# Patient Record
Sex: Female | Born: 2000 | Race: White | Hispanic: No | Marital: Single | Smoking: Current some day smoker
Health system: Southern US, Community
[De-identification: ages and names within clinical notes are randomized; demographics above are authoritative.]

## PROBLEM LIST (undated history)

## (undated) DIAGNOSIS — F32A Depression, unspecified: Secondary | ICD-10-CM

## (undated) DIAGNOSIS — R569 Unspecified convulsions: Secondary | ICD-10-CM

## (undated) DIAGNOSIS — R011 Cardiac murmur, unspecified: Secondary | ICD-10-CM

## (undated) DIAGNOSIS — G40909 Epilepsy, unspecified, not intractable, without status epilepticus: Secondary | ICD-10-CM

## (undated) DIAGNOSIS — F10129 Alcohol abuse with intoxication, unspecified: Secondary | ICD-10-CM

## (undated) DIAGNOSIS — F3481 Disruptive mood dysregulation disorder: Secondary | ICD-10-CM

## (undated) DIAGNOSIS — Z720 Tobacco use: Secondary | ICD-10-CM

## (undated) DIAGNOSIS — Z7289 Other problems related to lifestyle: Secondary | ICD-10-CM

## (undated) DIAGNOSIS — D649 Anemia, unspecified: Secondary | ICD-10-CM

## (undated) DIAGNOSIS — F419 Anxiety disorder, unspecified: Secondary | ICD-10-CM

## (undated) DIAGNOSIS — F1210 Cannabis abuse, uncomplicated: Secondary | ICD-10-CM

## (undated) DIAGNOSIS — D496 Neoplasm of unspecified behavior of brain: Secondary | ICD-10-CM

## (undated) HISTORY — DX: Tobacco use: Z72.0

## (undated) HISTORY — DX: Epilepsy, unspecified, not intractable, without status epilepticus: G40.909

## (undated) HISTORY — DX: Alcohol abuse with intoxication, unspecified: F10.129

## (undated) HISTORY — DX: Disruptive mood dysregulation disorder: F34.81

## (undated) HISTORY — DX: Anemia, unspecified: D64.9

## (undated) HISTORY — DX: Anxiety disorder, unspecified: F41.9

## (undated) HISTORY — PX: OTHER SURGICAL HISTORY: SHX169

## (undated) HISTORY — PX: NO PAST SURGERIES: SHX2092

## (undated) HISTORY — DX: Other problems related to lifestyle: Z72.89

## (undated) HISTORY — DX: Depression, unspecified: F32.A

---
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
## 2021-04-08 ENCOUNTER — Ambulatory Visit (INDEPENDENT_AMBULATORY_CARE_PROVIDER_SITE_OTHER): Payer: Medicaid Other | Admitting: Certified Nurse Midwife

## 2021-04-08 ENCOUNTER — Encounter: Payer: Self-pay | Admitting: Certified Nurse Midwife

## 2021-04-08 ENCOUNTER — Other Ambulatory Visit: Payer: Self-pay

## 2021-04-08 VITALS — BP 132/85 | HR 98 | Ht 62.0 in | Wt 126.2 lb

## 2021-04-08 DIAGNOSIS — R569 Unspecified convulsions: Secondary | ICD-10-CM | POA: Diagnosis not present

## 2021-04-08 DIAGNOSIS — N911 Secondary amenorrhea: Secondary | ICD-10-CM | POA: Diagnosis not present

## 2021-04-08 NOTE — Patient Instructions (Signed)
Secondary Amenorrhea  Secondary amenorrhea occurs when a female who was previously having menstrual periods has not had them for 3-6 months. A menstrual period is the monthly shedding of the lining of the uterus. The lining of the uterus is made up of blood, tissue, fluid, and mucus. The flow of blood usually occurs during 3-7consecutive days each month. This condition has many causes. In many cases, treating the underlying causewill return menstrual periods back to a normal cycle. What are the causes? The most common cause of this condition is pregnancy. Other medical conditions that can cause secondary amenorrhea include: Cirrhosis of the liver. Conditions of the blood. Diabetes. Epilepsy. Chronic kidney disease. Polycystic ovary disease. A hormonal imbalance. Ovarian failure. Cystic fibrosis. Early menopause. Cushing syndrome. Thyroid problems. Other causes may include: Malnutrition. Stress or anxiety. Medicines. Extreme obesity. Low body weight or drastic weight loss. Removal of the ovaries or uterus. Contraceptive pills, patches, or vaginal rings. What increases the risk? You are more likely to develop this condition if: You have a family history of this condition. You have an eating disorder. You do extreme athletic training. You have a chronic disease. You abuse substances such as alcohol or cigarettes. What are the signs or symptoms? The main symptom of this condition is a lack of menstrual periods for 3-6months in a female who previously had menstrual periods. How is this diagnosed? This condition may be diagnosed based on: Your medical history. A physical exam. A pelvic exam to check for problems with your reproductive organs. A procedure to examine the uterus. A measurement of your body mass index (BMI). You may also have other tests, including: Blood tests that measure certain hormones in your body and rule out pregnancy. Urine tests. Imaging tests, such as an  ultrasound, CT scan, or MRI. How is this treated? Treatment for this condition depends on the cause of the amenorrhea. It may involve: Correcting diet-related problems. Treating underlying conditions. Medicines. Lifestyle changes. Surgery. If the condition cannot be corrected, it is sometimes possible to startmenstrual periods with medicines. Follow these instructions at home: Lifestyle     Maintain a healthy diet. In general, a healthy diet includes lots of fruits and vegetables, low-fat dairy products, lean meats, and foods that contain fiber. Ask to meet with a registered dietitian for nutrition counseling and meal planning. Maintain a healthy weight. Talk to your health care provider before trying any new diet or exercise plan. Exercise at least 30 minutes 5 or more days each week. Exercising includes brisk walking, yard work, biking, running, swimming, and team sports like basketball and soccer. Ask your health care provider which exercises are safe for you. Get enough sleep. Plan your sleep time to allow for 7-9 hours of sleep each night. Learn to manage stress. Explore relaxation techniques such as meditation, journaling, yoga, or tai chi. General instructions Be aware of changes in your menstrual cycle. Keep a record of when you have your menstrual period. Note the date your period starts, how long it lasts, and any problems you experience. Take over-the-counter and prescription medicines only as told by your health care provider. Keep all follow-up visits. This is important. Contact a health care provider if: Your periods do not return to normal after treatment. Summary Secondary amenorrhea is when a female who was previously having menstrual periods has not gotten her period for 3-6 months. This condition has many causes. In many cases, treating the underlying cause will return menstrual periods back to a normal cycle. Talk to   to your health care provider if your periods do not  return to normal after treatment. This information is not intended to replace advice given to you by your health care provider. Make sure you discuss any questions you have with your health care provider. Document Revised: 09/27/2019 Document Reviewed: 09/27/2019 Elsevier Patient Education  2022 [employer].

## 2021-04-08 NOTE — Progress Notes (Addendum)
GYN ENCOUNTER NOTE  Subjective:       Diane Kelly is a 21 y.o. G0P0000 female is here for gynecologic evaluation of the following issues:  1. No period x 1 year, pt state she was on birth control (depo injections) approximately 1 yr ago. She started her period at 21 yrs old states they were regular until starting birth control . She has used nexplanon a while ago and stopped depo injections x 1 yr. She denies any significant weight changes. She does have a history of epilepsy and is on Depakote and trileptal.     Gynecologic History No LMP recorded (lmp unknown). Contraception: condoms Last Pap: n/a .   Obstetric History OB History  Gravida Para Term Preterm AB Living  0 0 0 0 0 0  SAB IAB Ectopic Multiple Live Births  0 0 0 0 0    Past Medical History:  Diagnosis Date   Epilepsy ([place])    Seizures ([place])     Past Surgical History:  Procedure Laterality Date   denies      Current Outpatient Medications on File Prior to Visit  Medication Sig Dispense Refill   desmopressin (DDAVP) 0.1 MG tablet Take 0.5 tablets (0.05 mg total) by mouth at bedtime. (Patient not taking: Reported on 09/05/2019) 30 tablet 0   diazepam (DIASTAT ACUDIAL) 10 MG GEL Place 10 mg rectally once as needed for seizure. (Patient not taking: Reported on 09/05/2019)     divalproex (DEPAKOTE) 250 MG DR tablet Take 1 tablet (250 mg total) by mouth 3 (three) times daily. (Patient not taking: Reported on 09/05/2019) 90 tablet 0   escitalopram (LEXAPRO) 10 MG tablet Take 1 tablet (10 mg total) by mouth daily. (Patient not taking: Reported on 09/05/2019) 30 tablet 0   Oxcarbazepine (TRILEPTAL) 300 MG tablet Take 1 tablet (300 mg total) by mouth 2 (two) times daily. 60 tablet 0   No current facility-administered medications on file prior to visit.    No Known Allergies  Social History   Socioeconomic History   Marital status: Single    Spouse name: Not on file   Number of children: Not on file   Years of  education: Not on file   Highest education level: Not on file  Occupational History   Not on file  Tobacco Use   Smoking status: Every Day    Packs/day: 0.50    Types: Cigarettes   Smokeless tobacco: Current  Vaping Use   Vaping Use: Never used  Substance and Sexual Activity   Alcohol use: Yes   Drug use: Yes    Types: Marijuana   Sexual activity: Yes    Birth control/protection: Implant  Other Topics Concern   Not on file  Social History Narrative   Not on file   Social Determinants of Health   Financial Resource Strain: Not on file  Food Insecurity: Not on file  Transportation Needs: Not on file  Physical Activity: Not on file  Stress: Not on file  Social Connections: Not on file  Intimate Partner Violence: Not on file    Family History  Problem Relation Age of Onset   Hypertension Mother    COPD Mother    COPD Maternal Grandmother    Anxiety disorder Maternal Grandmother    Sleep apnea Maternal Grandmother    COPD Maternal Grandfather    Anxiety disorder Paternal Grandmother    Asthma Paternal Grandmother    Depression Paternal Grandmother     The following portions  of the patient's history were reviewed and updated as appropriate: allergies, current medications, past family history, past medical history, past social history, past surgical history and problem list.  Review of Systems Review of Systems - Negative except as mentioned in hpi Review of Systems - General ROS: negative for - chills, fatigue, fever, hot flashes, malaise or night sweats Hematological and Lymphatic ROS: negative for - bleeding problems or swollen lymph nodes Gastrointestinal ROS: negative for - abdominal pain, blood in stools, change in bowel habits and nausea/vomiting Musculoskeletal ROS: negative for - joint pain, muscle pain or muscular weakness Genito-Urinary ROS: negative for -  dysmenorrhea, dyspareunia, dysuria, genital discharge, genital ulcers, hematuria, incontinence,  irregular/heavy menses, nocturia or pelvic pain. Positive for change in menstrual cycle,  Objective:   BP 132/85    Pulse 98    Ht 5\' 2"  (1.575 m)    Wt 126 lb 3.2 oz (57.2 kg)    LMP  (LMP Unknown)    BMI 23.08 kg/m  CONSTITUTIONAL: Well-developed, well-nourished female in no acute distress.  HENT:  Normocephalic, atraumatic.  NECK: Normal range of motion, supple, no masses.  Normal thyroid.  SKIN: Skin is warm and dry. No rash noted. Not diaphoretic. No erythema. No pallor. [place]: Alert and oriented to person, place, and time. PSYCHIATRIC: Normal mood and affect. Normal behavior. Normal judgment and thought content. CARDIOVASCULAR:Not Examined RESPIRATORY: Not Examined BREASTS: Not Examined ABDOMEN: Soft, non distended; Non tender.  No Organomegaly. PELVIC: not indicated u/s ordered  MUSCULOSKELETAL: Normal range of motion. No tenderness.  No cyanosis, clubbing, or edema.   Assessment:  Secondary Amenorrhea    Plan:   Discussed possible causes including current medications she is on for epilepsy. Also reviewed PCOS. Orders placed for labs and u/s. Discussed use of birth control . Will follow up with results. Pt to return for u/s as soon as able and annual exam with pap in August.   Philip Aspen, CNM

## 2021-04-09 ENCOUNTER — Other Ambulatory Visit: Payer: Medicaid Other

## 2021-04-09 LAB — FSH/LH
FSH: 1.7 m[IU]/mL
LH: 13.7 m[IU]/mL

## 2021-04-09 LAB — TSH: TSH: 1.33 u[IU]/mL (ref 0.450–4.500)

## 2021-04-09 LAB — TESTOSTERONE: Testosterone: 24 ng/dL (ref 13–71)

## 2021-04-09 LAB — PROLACTIN: Prolactin: 10.8 ng/mL (ref 4.8–23.3)

## 2021-04-09 NOTE — Progress Notes (Signed)
LM with pt relaying normal lab results.

## 2021-04-10 LAB — BETA HCG QUANT (REF LAB): hCG Quant: 6 m[IU]/mL

## 2021-04-10 LAB — SPECIMEN STATUS REPORT

## 2021-04-12 ENCOUNTER — Other Ambulatory Visit: Payer: Self-pay | Admitting: Certified Nurse Midwife

## 2021-04-12 DIAGNOSIS — N912 Amenorrhea, unspecified: Secondary | ICD-10-CM

## 2021-04-15 NOTE — Progress Notes (Signed)
LM for patient to call back in regards to lab results.

## 2021-04-15 NOTE — Progress Notes (Signed)
Relayed result to pt., patient verbalized understanding and voiced no questions or concerns, agrees to come in for suggested lab work.

## 2021-04-16 ENCOUNTER — Other Ambulatory Visit: Payer: Medicaid Other

## 2021-04-21 ENCOUNTER — Other Ambulatory Visit: Payer: Self-pay

## 2021-04-21 ENCOUNTER — Other Ambulatory Visit: Payer: Medicaid Other

## 2021-04-22 LAB — FSH/LH
FSH: 6.9 m[IU]/mL
LH: 10.9 m[IU]/mL

## 2021-04-22 LAB — BETA HCG QUANT (REF LAB): hCG Quant: <1 m[IU]/mL

## 2021-04-22 LAB — DHEA-SULFATE: DHEA-SO4: 62.7 ug/dL — ABNORMAL LOW (ref 110.0–431.7)

## 2021-04-22 LAB — INSULIN, RANDOM: INSULIN: 10.3 u[IU]/mL (ref 2.6–24.9)

## 2021-04-22 LAB — GLUCOSE, RANDOM: Glucose: 94 mg/dL (ref 70–99)

## 2021-05-05 NOTE — Progress Notes (Signed)
LM for patient to return call.

## 2021-10-03 ENCOUNTER — Encounter: Payer: Self-pay | Admitting: Certified Nurse Midwife

## 2021-10-21 ENCOUNTER — Encounter: Payer: Medicaid Other | Admitting: Certified Nurse Midwife

## 2021-11-17 ENCOUNTER — Other Ambulatory Visit: Payer: Self-pay

## 2021-11-17 DIAGNOSIS — Z20822 Contact with and (suspected) exposure to covid-19: Secondary | ICD-10-CM | POA: Insufficient documentation

## 2021-11-17 DIAGNOSIS — J029 Acute pharyngitis, unspecified: Secondary | ICD-10-CM | POA: Diagnosis not present

## 2021-11-17 DIAGNOSIS — J069 Acute upper respiratory infection, unspecified: Secondary | ICD-10-CM | POA: Diagnosis not present

## 2021-11-17 DIAGNOSIS — Z5321 Procedure and treatment not carried out due to patient leaving prior to being seen by health care provider: Secondary | ICD-10-CM | POA: Insufficient documentation

## 2021-11-17 LAB — GROUP A STREP BY PCR: Group A Strep by PCR: NOT DETECTED

## 2021-11-17 NOTE — ED Provider Triage Note (Signed)
Emergency Medicine Provider Triage Evaluation Note  Diane Kelly , a 21 y.o. female  was evaluated in triage.  Pt complains of sore throat and uri symptoms. No SOB, difficulty swallowing.  Review of Systems  Positive: Uri symptoms Negative: CP, SOB, difficulty swallowing  Physical Exam  BP 128/83 (BP Location: Left Arm)   Pulse 91   Temp 98.4 F (36.9 C) (Oral)   Resp 18   Ht 5\' 2"  (1.575 m)   Wt 54 kg   LMP  (LMP Unknown)   SpO2 100%   BMI 21.77 kg/m  Gen:   Awake, no distress   Resp:  Normal effort  MSK:   Moves extremities without difficulty  Other:    Medical Decision Making  Medically screening exam initiated at 11:06 PM.  Appropriate orders placed.  Diane Kelly was informed that the remainder of the evaluation will be completed by another provider, this initial triage assessment does not replace that evaluation, and the importance of remaining in the ED until their evaluation is complete.  URI symptoms. Strep and covid ordered   Darletta Moll, PA-C 11/17/21 2306

## 2021-11-17 NOTE — ED Triage Notes (Signed)
Pt arrives from home POV. C/O increasing soreness in throat since this morning, "foggy head", H/A, "hot and cold flashes," runny nose,strong cough, sore upper back.Denies N/V. States that she took "zinc" tablet  without relief and another medication which she can not name.    Pt ambulatory and NAD to triage.

## 2021-11-18 ENCOUNTER — Emergency Department
Admission: EM | Admit: 2021-11-18 | Discharge: 2021-11-18 | Discharge status: Against Medical Advice | Payer: Medicaid Other | Attending: Emergency Medicine | Admitting: Emergency Medicine

## 2021-11-18 LAB — RESP PANEL BY RT-PCR (FLU A&B, COVID) ARPGX2
Influenza A by PCR: NEGATIVE
Influenza B by PCR: NEGATIVE
SARS Coronavirus 2 by RT PCR: NEGATIVE

## 2021-12-24 ENCOUNTER — Other Ambulatory Visit: Payer: Self-pay | Admitting: Physician Assistant

## 2021-12-24 DIAGNOSIS — R569 Unspecified convulsions: Secondary | ICD-10-CM

## 2022-01-14 ENCOUNTER — Ambulatory Visit
Admission: RE | Admit: 2022-01-14 | Discharge: 2022-01-14 | Disposition: A | Discharge status: Home / Self Care | Payer: Medicaid Other | Source: Ambulatory Visit | Attending: Physician Assistant | Admitting: Physician Assistant

## 2022-01-14 DIAGNOSIS — R569 Unspecified convulsions: Secondary | ICD-10-CM | POA: Diagnosis present

## 2022-01-14 DIAGNOSIS — G9389 Other specified disorders of brain: Secondary | ICD-10-CM

## 2022-01-14 HISTORY — DX: Other specified disorders of brain: G93.89

## 2022-01-14 MED ORDER — GADOBUTROL 1 MMOL/ML IV SOLN
5.0000 mL | Freq: Once | INTRAVENOUS | Status: AC | PRN
  Administered 2022-01-14: 5 mL via INTRAVENOUS

## 2022-08-24 ENCOUNTER — Telehealth: Payer: Self-pay

## 2022-08-24 NOTE — Telephone Encounter (Signed)
Notified Diane Kelly that Dr Myer Haff said he is happy to see her.

## 2022-08-24 NOTE — Telephone Encounter (Addendum)
-----   Message from Venetia Night, MD sent at 08/24/2022  2:32 PM EDT ----- Regarding: RE: MRI brain Happy to see her   ----- Message ----- From: Sharlot Gowda, RN Sent: 08/24/2022   8:23 AM EDT To: Venetia Night, MD Subject: MRI brain                                      Hey,  Janice Coffin, PA from [facility] asked if you would review this brain lesion. She would like to know if they should refer her to you or if this needs to go to [facility]?

## 2022-09-10 NOTE — Progress Notes (Deleted)
Referring Physician:  Bridgette Habermann, PA-C [address] [place],  [place] [address]  Primary Physician:  Alm Bustard, NP  History of Present Illness: 09/10/2022 [name] is here today with a chief complaint of ***  Patient is here to be evaluated for ongoing seizure-like activity. Notes that most recent seizure was around June 2024.   Past Surgery: ***? Denies   Diane Kelly has ***no symptoms of cervical myelopathy.  The symptoms are causing a significant impact on the patient's life.   I have utilized the care everywhere function in epic to review the outside records available from external health systems.  Review of Systems:  A 10 point review of systems is negative, except for the pertinent positives and negatives detailed in the HPI.  Past Medical History: Past Medical History:  Diagnosis Date   Epilepsy ([facility])    Seizures ([facility])     Past Surgical History: Past Surgical History:  Procedure Laterality Date   denies      Allergies: Allergies as of 09/15/2022   (No Known Allergies)    Medications:  Current Outpatient Medications:    desmopressin (DDAVP) 0.1 MG tablet, Take 0.5 tablets (0.05 mg total) by mouth at bedtime. (Patient not taking: Reported on 09/05/2019), Disp: 30 tablet, Rfl: 0   diazepam (DIASTAT ACUDIAL) 10 MG GEL, Place 10 mg rectally once as needed for seizure. (Patient not taking: Reported on 09/05/2019), Disp: , Rfl:    divalproex (DEPAKOTE) 250 MG DR tablet, Take 1 tablet (250 mg total) by mouth 3 (three) times daily. (Patient not taking: Reported on 09/05/2019), Disp: 90 tablet, Rfl: 0   escitalopram (LEXAPRO) 10 MG tablet, Take 1 tablet (10 mg total) by mouth daily. (Patient not taking: Reported on 09/05/2019), Disp: 30 tablet, Rfl: 0   Oxcarbazepine (TRILEPTAL) 300 MG tablet, Take 1 tablet (300 mg total) by mouth 2 (two) times daily., Disp: 60 tablet, Rfl: 0  Social History: Social History   Tobacco Use   Smoking  status: Every Day    Current packs/day: 0.50    Types: Cigarettes   Smokeless tobacco: Current  Vaping Use   Vaping status: Never Used  Substance Use Topics   Alcohol use: Yes    Comment: socially   Drug use: Not Currently    Family Medical History: Family History  Problem Relation Age of Onset   Hypertension Mother    COPD Mother    COPD Maternal Grandmother    Anxiety disorder Maternal Grandmother    Sleep apnea Maternal Grandmother    COPD Maternal Grandfather    Anxiety disorder Paternal Grandmother    Asthma Paternal Grandmother    Depression Paternal Grandmother     Physical Examination: There were no vitals filed for this visit.  General: Patient is in no apparent distress. Attention to examination is appropriate.  Neck:   Supple.  Full range of motion.  Respiratory: Patient is breathing without any difficulty.   NEUROLOGICAL:     Awake, alert, oriented to person, place, and time.  Speech is clear and fluent.   Cranial Nerves: Pupils equal round and reactive to light.  Facial tone is symmetric.  Facial sensation is symmetric. Shoulder shrug is symmetric. Tongue protrusion is midline.  There is no pronator drift.  Strength: Side Biceps Triceps Deltoid Interossei Grip Wrist Ext. Wrist Flex.  R 5 5 5 5 5 5 5   L 5 5 5 5 5 5 5    Side Iliopsoas Quads Hamstring  PF DF EHL  R 5 5 5 5 5 5   L 5 5 5 5 5 5    Reflexes are ***2+ and symmetric at the biceps, triceps, brachioradialis, patella and achilles.   Hoffman's is absent.   Bilateral upper and lower extremity sensation is intact to light touch.    No evidence of dysmetria noted.  Gait is normal.     Medical Decision Making  Imaging: ***  I have personally reviewed the images and agree with the above interpretation.  Assessment and Plan: [name] is a pleasant 22 y.o. female with ***    Thank you for involving me in the care of this patient.      Chester K. Myer Haff MD, [facility]

## 2022-09-15 ENCOUNTER — Ambulatory Visit: Payer: Medicaid Other | Admitting: Neurosurgery

## 2022-09-25 ENCOUNTER — Other Ambulatory Visit: Payer: Self-pay

## 2022-09-25 ENCOUNTER — Emergency Department
Admission: EM | Admit: 2022-09-25 | Discharge: 2022-09-25 | Disposition: A | Discharge status: Home / Self Care | Payer: Medicaid Other | Attending: Emergency Medicine | Admitting: Emergency Medicine

## 2022-09-25 ENCOUNTER — Emergency Department: Payer: Medicaid Other

## 2022-09-25 DIAGNOSIS — R000 Tachycardia, unspecified: Secondary | ICD-10-CM | POA: Diagnosis not present

## 2022-09-25 DIAGNOSIS — R569 Unspecified convulsions: Secondary | ICD-10-CM | POA: Insufficient documentation

## 2022-09-25 DIAGNOSIS — R519 Headache, unspecified: Secondary | ICD-10-CM | POA: Insufficient documentation

## 2022-09-25 DIAGNOSIS — N3090 Cystitis, unspecified without hematuria: Secondary | ICD-10-CM | POA: Insufficient documentation

## 2022-09-25 LAB — URINE DRUG SCREEN, QUALITATIVE (ARMC ONLY)
Amphetamines, Ur Screen: NOT DETECTED
Barbiturates, Ur Screen: NOT DETECTED
Benzodiazepine, Ur Scrn: NOT DETECTED
Cannabinoid 50 Ng, Ur ~~LOC~~: POSITIVE — AB
Cocaine Metabolite,Ur ~~LOC~~: NOT DETECTED
MDMA (Ecstasy)Ur Screen: NOT DETECTED
Methadone Scn, Ur: NOT DETECTED
Opiate, Ur Screen: NOT DETECTED
Phencyclidine (PCP) Ur S: NOT DETECTED
Tricyclic, Ur Screen: NOT DETECTED

## 2022-09-25 LAB — URINALYSIS, ROUTINE W REFLEX MICROSCOPIC
Bilirubin Urine: NEGATIVE
Glucose, UA: NEGATIVE mg/dL
Ketones, ur: 5 mg/dL — AB
Nitrite: POSITIVE — AB
Protein, ur: 30 mg/dL — AB
Specific Gravity, Urine: 1.02 (ref 1.005–1.030)
pH: 5 (ref 5.0–8.0)

## 2022-09-25 LAB — CBC
HCT: 38.1 % (ref 36.0–46.0)
Hemoglobin: 12.8 g/dL (ref 12.0–15.0)
MCH: 29.8 pg (ref 26.0–34.0)
MCHC: 33.6 g/dL (ref 30.0–36.0)
MCV: 88.6 fL (ref 80.0–100.0)
Platelets: 265 10*3/uL (ref 150–400)
RBC: 4.3 MIL/uL (ref 3.87–5.11)
RDW: 12.6 % (ref 11.5–15.5)
WBC: 14.2 10*3/uL — ABNORMAL HIGH (ref 4.0–10.5)
nRBC: 0 % (ref 0.0–0.2)

## 2022-09-25 LAB — COMPREHENSIVE METABOLIC PANEL
ALT: 14 U/L (ref 0–44)
AST: 15 U/L (ref 15–41)
Albumin: 4.3 g/dL (ref 3.5–5.0)
Alkaline Phosphatase: 77 U/L (ref 38–126)
Anion gap: 11 (ref 5–15)
BUN: 13 mg/dL (ref 6–20)
CO2: 23 mmol/L (ref 22–32)
Calcium: 8.9 mg/dL (ref 8.9–10.3)
Chloride: 104 mmol/L (ref 98–111)
Creatinine, Ser: 0.68 mg/dL (ref 0.44–1.00)
GFR, Estimated: >60 mL/min (ref >60)
Glucose, Bld: 127 mg/dL — ABNORMAL HIGH (ref 70–99)
Potassium: 3.4 mmol/L — ABNORMAL LOW (ref 3.5–5.1)
Sodium: 138 mmol/L (ref 135–145)
Total Bilirubin: 0.4 mg/dL (ref 0.3–1.2)
Total Protein: 7.3 g/dL (ref 6.5–8.1)

## 2022-09-25 LAB — TROPONIN I (HIGH SENSITIVITY)
Troponin I (High Sensitivity): 40 ng/L — ABNORMAL HIGH (ref <18)
Troponin I (High Sensitivity): 76 ng/L — ABNORMAL HIGH (ref <18)

## 2022-09-25 LAB — POC URINE PREG, ED: Preg Test, Ur: NEGATIVE

## 2022-09-25 MED ORDER — ONDANSETRON HCL 4 MG/2ML IJ SOLN
4.0000 mg | Freq: Once | INTRAMUSCULAR | Status: AC
  Administered 2022-09-25: 4 mg via INTRAVENOUS
  Filled 2022-09-25: qty 2

## 2022-09-25 MED ORDER — SODIUM CHLORIDE 0.9 % IV BOLUS
1000.0000 mL | Freq: Once | INTRAVENOUS | Status: AC
  Administered 2022-09-25: 1000 mL via INTRAVENOUS

## 2022-09-25 MED ORDER — CEPHALEXIN 500 MG PO CAPS
500.0000 mg | ORAL_CAPSULE | Freq: Once | ORAL | Status: AC
  Administered 2022-09-25: 500 mg via ORAL
  Filled 2022-09-25: qty 1

## 2022-09-25 MED ORDER — KETOROLAC TROMETHAMINE 15 MG/ML IJ SOLN
15.0000 mg | Freq: Once | INTRAMUSCULAR | Status: AC
  Administered 2022-09-25: 15 mg via INTRAVENOUS
  Filled 2022-09-25: qty 1

## 2022-09-25 MED ORDER — CEPHALEXIN 500 MG PO CAPS: 500.0000 mg | ORAL_CAPSULE | Freq: Two times a day (BID) | ORAL | 0 refills | Status: AC

## 2022-09-25 NOTE — ED Provider Notes (Signed)
[facility] Provider Note    Event Date/Time   First MD Initiated Contact with Patient 09/25/22 0229     (approximate)   History   Chief Complaint: Seizures   HPI  Diane Kelly is a 22 y.o. female with a history of epilepsy, mood disorder who comes the ED complaining of headache.  She reports having a seizure tonight around midnight which followed a typical pattern for her including a prodrome that is familiar, loss of consciousness, and then having headache afterward.  Denies any trauma or fever or other complaints, no recent illness.  She reports that her seizures seem to be worse when she is stressed and she is feeling stressed because she lost her job.     Physical Exam   Triage Vital Signs: ED Triage Vitals  Encounter Vitals Group     BP 09/25/22 0212 120/82     Systolic BP Percentile --      Diastolic BP Percentile --      Pulse Rate 09/25/22 0212 89     Resp 09/25/22 0212 18     Temp 09/25/22 0212 97.8 F (36.6 C)     Temp Source 09/25/22 0212 Oral     SpO2 09/25/22 0212 100 %     Weight 09/25/22 0217 115 lb (52.2 kg)     Height 09/25/22 0217 5\' 2"  (1.575 m)     Head Circumference --      Peak Flow --      Pain Score 09/25/22 0215 10     Pain Loc --      Pain Education --      Exclude from Growth Chart --     Most recent vital signs: Vitals:   09/25/22 0212 09/25/22 0430  BP: 120/82 (!) 102/55  Pulse: 89 73  Resp: 18   Temp: 97.8 F (36.6 C)   SpO2: 100% 98%    General: Awake, no distress.  CV:  Good peripheral perfusion.  Resp:  Normal effort.  Abd:  No distention.  Other:  Awake alert oriented.  GCS 15, moving all extremities   ED Results / Procedures / Treatments   Labs (all labs ordered are listed, but only abnormal results are displayed) Labs Reviewed  CBC - Abnormal; Notable for the following components:      Result Value   WBC 14.2 (*)    All other components within normal limits  COMPREHENSIVE  METABOLIC PANEL - Abnormal; Notable for the following components:   Potassium 3.4 (*)    Glucose, Bld 127 (*)    All other components within normal limits  URINALYSIS, ROUTINE W REFLEX MICROSCOPIC - Abnormal; Notable for the following components:   Color, Urine YELLOW (*)    APPearance CLOUDY (*)    Hgb urine dipstick SMALL (*)    Ketones, ur 5 (*)    Protein, ur 30 (*)    Nitrite POSITIVE (*)    Leukocytes,Ua SMALL (*)    Bacteria, UA RARE (*)    All other components within normal limits  URINE DRUG SCREEN, QUALITATIVE ([facility] ONLY) - Abnormal; Notable for the following components:   Cannabinoid 50 Ng, Ur [place] POSITIVE (*)    All other components within normal limits  TROPONIN I (HIGH SENSITIVITY) - Abnormal; Notable for the following components:   Troponin I (High Sensitivity) 40 (*)    All other components within normal limits  TROPONIN I (HIGH SENSITIVITY) - Abnormal; Notable for the following components:  Troponin I (High Sensitivity) 76 (*)    All other components within normal limits  POC URINE PREG, ED     EKG Interpreted by me Sinus tachycardia rate 101.  Normal axis, normal intervals.  Normal QRS ST segments and T waves   RADIOLOGY CT head interpreted by me, appears normal.  Radiology report reviewed   PROCEDURES:  Procedures   MEDICATIONS ORDERED IN ED: Medications  ketorolac (TORADOL) 15 MG/ML injection 15 mg (15 mg Intravenous Given 09/25/22 0316)  ondansetron (ZOFRAN) injection 4 mg (4 mg Intravenous Given 09/25/22 0315)  sodium chloride 0.9 % bolus 1,000 mL (0 mLs Intravenous Stopped 09/25/22 0419)  cephALEXin (KEFLEX) capsule 500 mg (500 mg Oral Given 09/25/22 0441)     IMPRESSION / MDM / ASSESSMENT AND PLAN / ED COURSE  I reviewed the triage vital signs and the nursing notes.  DDx: Seizure, dehydration, electrolyte abnormality, UTI, pregnancy, complicated migraine  Patient's presentation is most consistent with acute presentation with potential threat to  life or bodily function.  Patient presents with suspected seizure.  She has been compliant with her medications.  Exam and vital signs are unremarkable.  She is back to baseline mental status.  Serum labs unremarkable, pregnancy negative.  CT head negative.  Will give Toradol Zofran and fluids for symptom relief.   ----------------------------------------- 5:30 AM on 09/25/2022 ----------------------------------------- Labs reveal UTI as well as cannabinoid use, both of which may increase propensity for seizure.  Will start on Keflex.  No further seizure activity in the ED, stable for discharge.      FINAL CLINICAL IMPRESSION(S) / ED DIAGNOSES   Final diagnoses:  Seizure ([facility])  Cystitis     Rx / DC Orders   ED Discharge Orders          Ordered    cephALEXin (KEFLEX) 500 MG capsule  2 times daily        09/25/22 0529             Note:  This document was prepared using Dragon voice recognition software and may include unintentional dictation errors.   Sharman Cheek, MD 09/25/22 0530

## 2022-09-25 NOTE — ED Triage Notes (Signed)
Pt presents to ER with c/o possible seizure that happened between 2330-0000 yesterday.  Pt reports she was feeling weird, tried to call her s/o, and then went out.  Pt states she has hx of epilepsy and takes oxcarbazepine.  States she has taken meds as prescribed.  Pt endorses severe HA and pain all over at this time.  Pt is otherwise A&O x4 and in NAD at this time.

## 2022-11-18 ENCOUNTER — Other Ambulatory Visit: Payer: Self-pay | Admitting: Neurology

## 2022-11-18 DIAGNOSIS — R569 Unspecified convulsions: Secondary | ICD-10-CM

## 2022-12-01 ENCOUNTER — Encounter: Payer: Self-pay | Admitting: Neurology

## 2022-12-02 NOTE — Progress Notes (Deleted)
Referring Physician:  Bridgette Habermann, PA-C [address] [place],  [place] [address]  Primary Physician:  Alm Bustard, NP  History of Present Illness: 12/02/2022 Ms. Ninamarie Keel is here today with a chief complaint of ***  Brain Lesion that was found on a Head CT.  She has some issues with her vision? Denies any headaches or dizziness?  Past Surgery: ***no previous spinal surgeries  Balinda Quails Kelleher has ***no symptoms of cervical myelopathy.  The symptoms are causing a significant impact on the patient's life.   I have utilized the care everywhere function in epic to review the outside records available from external health systems.  Review of Systems:  A 10 point review of systems is negative, except for the pertinent positives and negatives detailed in the HPI.  Past Medical History: Past Medical History:  Diagnosis Date   Epilepsy (HCC)    Seizures (HCC)     Past Surgical History: Past Surgical History:  Procedure Laterality Date   denies      Allergies: Allergies as of 12/03/2022   (No Known Allergies)    Medications:  Current Outpatient Medications:    desmopressin (DDAVP) 0.1 MG tablet, Take 0.5 tablets (0.05 mg total) by mouth at bedtime. (Patient not taking: Reported on 09/05/2019), Disp: 30 tablet, Rfl: 0   diazepam (DIASTAT ACUDIAL) 10 MG GEL, Place 10 mg rectally once as needed for seizure. (Patient not taking: Reported on 09/05/2019), Disp: , Rfl:    divalproex (DEPAKOTE) 250 MG DR tablet, Take 1 tablet (250 mg total) by mouth 3 (three) times daily. (Patient not taking: Reported on 09/05/2019), Disp: 90 tablet, Rfl: 0   escitalopram (LEXAPRO) 10 MG tablet, Take 1 tablet (10 mg total) by mouth daily., Disp: 30 tablet, Rfl: 0   Oxcarbazepine (TRILEPTAL) 300 MG tablet, Take 1 tablet (300 mg total) by mouth 2 (two) times daily. (Patient not taking: Reported on 09/25/2022), Disp: 60 tablet, Rfl: 0   oxcarbazepine (TRILEPTAL) 600 MG tablet,  TAKE 1 TABLET BY MOUTH EVERY MORNING AND 1 & 1/2 TABLET AT NIGHT, Disp: , Rfl:   Social History: Social History   Tobacco Use   Smoking status: Every Day    Current packs/day: 0.50    Types: Cigarettes   Smokeless tobacco: Current  Vaping Use   Vaping status: Never Used  Substance Use Topics   Alcohol use: Yes    Comment: socially   Drug use: Not Currently    Family Medical History: Family History  Problem Relation Age of Onset   Hypertension Mother    COPD Mother    COPD Maternal Grandmother    Anxiety disorder Maternal Grandmother    Sleep apnea Maternal Grandmother    COPD Maternal Grandfather    Anxiety disorder Paternal Grandmother    Asthma Paternal Grandmother    Depression Paternal Grandmother     Physical Examination: There were no vitals filed for this visit.  General: Patient is in no apparent distress. Attention to examination is appropriate.  Neck:   Supple.  Full range of motion.  Respiratory: Patient is breathing without any difficulty.   NEUROLOGICAL:     Awake, alert, oriented to person, place, and time.  Speech is clear and fluent.   Cranial Nerves: Pupils equal round and reactive to light.  Facial tone is symmetric.  Facial sensation is symmetric. Shoulder shrug is symmetric. Tongue protrusion is midline.  There is no pronator drift.  Strength: Side Biceps Triceps Deltoid Interossei Grip  Wrist Ext. Wrist Flex.  R 5 5 5 5 5 5 5   L 5 5 5 5 5 5 5    Side Iliopsoas Quads Hamstring PF DF EHL  R 5 5 5 5 5 5   L 5 5 5 5 5 5    Reflexes are ***2+ and symmetric at the biceps, triceps, brachioradialis, patella and achilles.   Hoffman's is absent.   Bilateral upper and lower extremity sensation is intact to light touch.    No evidence of dysmetria noted.  Gait is normal.     Medical Decision Making  Imaging: ***  I have personally reviewed the images and agree with the above interpretation.  Assessment and Plan: Ms. Verhoeven is a pleasant  22 y.o. female with ***    Thank you for involving me in the care of this patient.      Chester K. Myer Haff MD, [facility] Neurosurgery

## 2022-12-03 ENCOUNTER — Ambulatory Visit: Payer: Medicaid Other | Admitting: Neurosurgery

## 2022-12-03 ENCOUNTER — Encounter: Payer: Self-pay | Admitting: Neurosurgery

## 2022-12-07 ENCOUNTER — Ambulatory Visit (INDEPENDENT_AMBULATORY_CARE_PROVIDER_SITE_OTHER): Payer: Medicaid Other | Admitting: Neurosurgery

## 2022-12-07 ENCOUNTER — Encounter: Payer: Self-pay | Admitting: Neurosurgery

## 2022-12-07 ENCOUNTER — Ambulatory Visit
Admission: RE | Admit: 2022-12-07 | Discharge: 2022-12-07 | Disposition: A | Discharge status: Home / Self Care | Payer: Medicaid Other | Source: Ambulatory Visit | Attending: Neurology | Admitting: Neurology

## 2022-12-07 VITALS — BP 108/62 | Ht 62.0 in | Wt 108.0 lb

## 2022-12-07 DIAGNOSIS — R569 Unspecified convulsions: Secondary | ICD-10-CM | POA: Diagnosis not present

## 2022-12-07 DIAGNOSIS — D496 Neoplasm of unspecified behavior of brain: Secondary | ICD-10-CM | POA: Diagnosis not present

## 2022-12-07 MED ORDER — GADOPICLENOL 0.5 MMOL/ML IV SOLN
7.5000 mL | Freq: Once | INTRAVENOUS | Status: AC | PRN
  Administered 2022-12-07: 5 mL via INTRAVENOUS

## 2022-12-07 NOTE — Patient Instructions (Signed)
Please see below for information in regards to your upcoming surgery:   Planned surgery: right craniotomy for tumor resection   Surgery date: 01/25/23 at [facility] (Medical Mall: [address] [place], [place] [address]) - you will find out your arrival time the business day before your surgery.   Pre-op appointment at [facility] Pre-admit Testing: we will call you with a date/time for this. If you are scheduled for an in person appointment, Pre-admit Testing is located on the first floor of the [facility] building, [address], [address]. Please bring all prescriptions in the original prescription bottles to your appointment. During this appointment, they will advise you which medications you can take the morning of surgery, and which medications you will need to hold for surgery. Labs (such as blood work, EKG) may be done at your pre-op appointment. You are not required to fast for these labs. Should you need to change your pre-op appointment, please call Pre-admit testing at [phone].       Surgical clearance: we will send a clearance form to Manson Allan, NP. They may wish to see you in their office prior to signing the clearance form. If so, they may call you to schedule an appointment.    Common restrictions after surgery: No bending, lifting, or twisting ("BLT"). Avoid lifting objects heavier than 10 pounds for the first 6 weeks after surgery. Where possible, avoid household activities that involve lifting, bending, reaching, pushing, or pulling such as laundry, vacuuming, grocery shopping, and childcare. Try to arrange for help from friends and family for these activities while you heal. Do not drive while taking prescription pain medication. Weeks 6 through 12 after surgery: avoid lifting more than 25 pounds.      How to contact us:  If you have any questions/concerns before or after surgery, you can reach [place] at [phone], or  you can send a mychart message. We can be reached by phone or mychart 8am-4pm, Monday-Friday.  *Please note: Calls after 4pm are forwarded to a third party answering service. Mychart messages are not routinely monitored during evenings, weekends, and holidays. Please call our office to contact the answering service for urgent concerns during non-business hours.    If you have FMLA/disability paperwork, please drop it off or fax it to [phone], attention Patty.   Appointments/FMLA & disability paperwork: Joycelyn Rua, & Flonnie Hailstone Registered Nurse/Surgery scheduler: Royston Cowper Medical Assistants: Nash Mantis Physician Assistants: Manning Charity, PA-C & Drake Leach, PA-C Surgeons: Venetia Night, MD & Ernestine Mcmurray, MD

## 2022-12-07 NOTE — Progress Notes (Unsigned)
Referring Physician:  Alm Bustard, NP [address] [place],  [place] [address]  Primary Physician:  Alm Bustard, NP  History of Present Illness: 12/07/2022 [name] is here today with a chief complaint of seizure disorder which is going on for many years.  She had a CT scan performed several years ago which showed evidence of a right temporal lesion.  She has been managed for her seizures for several years with different medications.  She is currently switching to lamotrigine.  She has seizures monthly, some of which generalized.  After a generalized seizure, she has a period of time where she is postictal.  She suffers from headaches during this time, as well as issues with moderation of her behavior.  She takes 1 to 2 days to return to normal.  She had a lesion found on her head CT, and ultimately was worked up with MRI scan.  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  The symptoms are causing a significant impact on the patient's life.   I have utilized the care everywhere function in epic to review the outside records available from external health systems.  Review of Systems:  A 10 point review of systems is negative, except for the pertinent positives and negatives detailed in the HPI.  Past Medical History: Past Medical History:  Diagnosis Date   Epilepsy (HCC)    Seizures (HCC)     Past Surgical History: Past Surgical History:  Procedure Laterality Date   denies      Allergies: Allergies as of 12/07/2022   (No Known Allergies)    Medications:  Current Outpatient Medications:    escitalopram (LEXAPRO) 20 MG tablet, Take 20 mg by mouth daily., Disp: , Rfl:    lamoTRIgine (LAMICTAL) 25 MG tablet, Wk 1:  1 tab PM, Wk 2:  1 tab AM and 1 tab PM, Wk 3:  1 tab AM and 2 tabs Pm, Wk 4:  2 tabs AM and 2 tabs PM Wk 5:  2 tabs AM and 3 tabs PM, Wk 6:  3 tabs AM and 3 tabs PM, Wk 7:  3 tabs AM and 4 tabs PM Week 8:  4 tabs AM and 4 tabs  PM, Wk 9:  4 tabs AM, and 5 tabs PM, Wk 10: 5 tabs AM, and 5 tabs PM, Disp: , Rfl:    meloxicam (MOBIC) 15 MG tablet, Take 15 mg by mouth daily., Disp: , Rfl:   Social History: Social History   Tobacco Use   Smoking status: Every Day    Current packs/day: 0.50    Types: Cigarettes   Smokeless tobacco: Current  Vaping Use   Vaping status: Never Used  Substance Use Topics   Alcohol use: Yes    Comment: socially   Drug use: Not Currently    Family Medical History: Family History  Problem Relation Age of Onset   Hypertension Mother    COPD Mother    COPD Maternal Grandmother    Anxiety disorder Maternal Grandmother    Sleep apnea Maternal Grandmother    COPD Maternal Grandfather    Anxiety disorder Paternal Grandmother    Asthma Paternal Grandmother    Depression Paternal Grandmother     Physical Examination: Vitals:   12/07/22 1541  BP: 108/62    General: Patient is in no apparent distress. Attention to examination is appropriate.  Neck:   Supple.  Full range of motion.  Respiratory: Patient is  breathing without any difficulty.   NEUROLOGICAL:     Awake, alert, oriented to person, place, and time.  Speech is clear and fluent.   Cranial Nerves: Pupils equal round and reactive to light.  Facial tone is symmetric.  Facial sensation is symmetric. Shoulder shrug is symmetric. Tongue protrusion is midline.  There is no pronator drift.  VFF to confrontation  Strength: Side Biceps Triceps Deltoid Interossei Grip Wrist Ext. Wrist Flex.  R 5 5 5 5 5 5 5   L 5 5 5 5 5 5 5    Side Iliopsoas Quads Hamstring PF DF EHL  R 5 5 5 5 5 5   L 5 5 5 5 5 5    Reflexes are 1+ and symmetric at the biceps, triceps, brachioradialis, patella and achilles.   Hoffman's is absent.   Bilateral upper and lower extremity sensation is intact to light touch.    No evidence of dysmetria noted.  Gait is normal.     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 Differential includes low-grade neoplasm (favored) or possibly an anterior temporal lobe perivascular space.     Electronically Signed   By: Lesia Hausen M.D.   On: 01/16/2022 14:26    MR Brain 12/07/2022 Similar appearance to above with slight increase on my measurement.  I have personally reviewed the images and agree with the above interpretation.  Assessment and Plan: Diane Kelly is a pleasant 22 y.o. 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  There is a small cystic component as well as a FLAIR abnormality.  Given the questionable change between the last year scan at this point, I have recommended surgical intervention with resection of this lesion.  There is some chance that this is the proximate cause of her seizure disorder.  There is an additional reason to consider lesionectomy.  Because of these 2 issues, I have recommended right-sided craniotomy for resection of tumor.  We will utilize [employer] for image guidance.  I discussed the planned procedure at length with the patient, including the risks, benefits, alternatives, and indications. The risks discussed include but are not limited to bleeding, infection, need for reoperation, spinal fluid leak, stroke, vision loss, anesthetic complication, coma, paralysis, and even death. I also described in detail that improvement was not guaranteed.  The patient expressed understanding of these risks, and asked that we proceed with surgery. I described the surgery in layman's terms, and gave ample opportunity for questions, which were answered to the best of my ability.  I spent a total of 30 minutes in this patient's care today. This time was spent reviewing pertinent records including imaging studies, obtaining and confirming history, performing a  directed evaluation, formulating and discussing my recommendations, and documenting the visit within the medical record.     Thank you for involving me in the care of this patient.      Samaia Iwata K. Myer Haff MD, [facility] Neurosurgery

## 2022-12-16 ENCOUNTER — Other Ambulatory Visit: Payer: Self-pay

## 2022-12-16 DIAGNOSIS — Z01818 Encounter for other preprocedural examination: Secondary | ICD-10-CM

## 2023-01-13 ENCOUNTER — Encounter
Admission: RE | Admit: 2023-01-13 | Discharge: 2023-01-13 | Disposition: A | Discharge status: Home / Self Care | Payer: Medicaid Other | Source: Ambulatory Visit | Attending: Neurosurgery | Admitting: Neurosurgery

## 2023-01-13 VITALS — BP 102/67 | HR 79 | Resp 14 | Ht 62.0 in | Wt 105.8 lb

## 2023-01-13 DIAGNOSIS — R569 Unspecified convulsions: Secondary | ICD-10-CM

## 2023-01-13 DIAGNOSIS — Z01812 Encounter for preprocedural laboratory examination: Secondary | ICD-10-CM | POA: Diagnosis present

## 2023-01-13 DIAGNOSIS — G9389 Other specified disorders of brain: Secondary | ICD-10-CM | POA: Insufficient documentation

## 2023-01-13 DIAGNOSIS — Z01818 Encounter for other preprocedural examination: Secondary | ICD-10-CM

## 2023-01-13 DIAGNOSIS — Z79899 Other long term (current) drug therapy: Secondary | ICD-10-CM | POA: Insufficient documentation

## 2023-01-13 HISTORY — DX: Cannabis abuse, uncomplicated: F12.10

## 2023-01-13 HISTORY — DX: Tobacco use: Z72.0

## 2023-01-13 HISTORY — DX: Neoplasm of unspecified behavior of brain: D49.6

## 2023-01-13 HISTORY — DX: Cardiac murmur, unspecified: R01.1

## 2023-01-13 LAB — CBC
HCT: 38.2 % (ref 36.0–46.0)
Hemoglobin: 13 g/dL (ref 12.0–15.0)
MCH: 29.6 pg (ref 26.0–34.0)
MCHC: 34 g/dL (ref 30.0–36.0)
MCV: 87 fL (ref 80.0–100.0)
Platelets: 253 10*3/uL (ref 150–400)
RBC: 4.39 MIL/uL (ref 3.87–5.11)
RDW: 12.5 % (ref 11.5–15.5)
WBC: 6.8 10*3/uL (ref 4.0–10.5)
nRBC: 0 % (ref 0.0–0.2)

## 2023-01-13 LAB — SURGICAL PCR SCREEN
MRSA, PCR: NEGATIVE
Staphylococcus aureus: NEGATIVE

## 2023-01-13 LAB — BASIC METABOLIC PANEL
Anion gap: 5 (ref 5–15)
BUN: 16 mg/dL (ref 6–20)
CO2: 25 mmol/L (ref 22–32)
Calcium: 9.2 mg/dL (ref 8.9–10.3)
Chloride: 106 mmol/L (ref 98–111)
Creatinine, Ser: 0.83 mg/dL (ref 0.44–1.00)
GFR, Estimated: >60 mL/min (ref >60)
Glucose, Bld: 78 mg/dL (ref 70–99)
Potassium: 3.9 mmol/L (ref 3.5–5.1)
Sodium: 136 mmol/L (ref 135–145)

## 2023-01-13 LAB — TYPE AND SCREEN
ABO/RH(D): O POS
Antibody Screen: NEGATIVE

## 2023-01-13 NOTE — Patient Instructions (Addendum)
Your procedure is scheduled on:01-25-23 Monday Report to the Registration Desk on the 1st floor of the Medical Mall.Then proceed to the 2nd floor Surgery Desk To find out your arrival time, please call [phone] between 1PM - 3PM on:01-22-23 Friday If your arrival time is 6:00 am, do not arrive before that time as the Medical Mall entrance doors do not open until 6:00 am.  REMEMBER: Instructions that are not followed completely may result in serious medical risk, up to and including death; or upon the discretion of your surgeon and anesthesiologist your surgery may need to be rescheduled.  Do not eat food after midnight the night before surgery.  No gum chewing or hard candies.  You may however, drink CLEAR liquids up to 2 hours before you are scheduled to arrive for your surgery. Do not drink anything within 2 hours of your scheduled arrival time.  Clear liquids include: - water  - apple juice without pulp - gatorade (not RED colors) - black coffee or tea (Do NOT add milk or creamers to the coffee or tea) Do NOT drink anything that is not on this list.  One week prior to surgery:Stop NOW (01-17-23) Stop ANY OVER THE COUNTER supplements until after surgery.  You may however, continue to take Tylenol if needed for pain up until the day of surgery.  Continue taking all of your other prescription medications up until the day of surgery.  ON THE DAY OF SURGERY ONLY TAKE THESE MEDICATIONS WITH SIPS OF WATER: -lamoTRIgine (LAMICTAL)   No Alcohol for 24 hours before or after surgery.  No Smoking including e-cigarettes for 24 hours before surgery.  No chewable tobacco products for at least 6 hours before surgery.  No nicotine patches on the day of surgery.  Do not use any "recreational" drugs for at least a week (preferably 2 weeks) before your surgery.  Please be advised that the combination of cocaine and anesthesia may have negative outcomes, up to and including death. If you  test positive for cocaine, your surgery will be cancelled.  On the morning of surgery brush your teeth with toothpaste and water, you may rinse your mouth with mouthwash if you wish. Do not swallow any toothpaste or mouthwash  Do not wear jewelry, make-up, hairpins, clips or nail polish.  For welded (permanent) jewelry: bracelets, anklets, waist bands, etc.  Please have this removed prior to surgery.  If it is not removed, there is a chance that hospital personnel will need to cut it off on the day of surgery.  Do not wear lotions, powders, or perfumes.   Do not shave body hair from the neck down 48 hours before surgery.  Contact lenses, hearing aids and dentures may not be worn into surgery.  Do not bring valuables to the hospital. [facility] is not responsible for any missing/lost belongings or valuables.   Notify your doctor if there is any change in your medical condition (cold, fever, infection).  Wear comfortable clothing (specific to your surgery type) to the hospital.  After surgery, you can help prevent lung complications by doing breathing exercises.  Take deep breaths and cough every 1-2 hours. Your doctor may order a device called an Incentive Spirometer to help you take deep breaths. When coughing or sneezing, hold a pillow firmly against your incision with both hands. This is called "splinting." Doing this helps protect your incision. It also decreases belly discomfort.  If you are being admitted to the hospital overnight, leave your suitcase  in the car. After surgery it may be brought to your room.  In case of increased patient census, it may be necessary for you, the patient, to continue your postoperative care in the Same Day Surgery department.  If you are being discharged the day of surgery, you will not be allowed to drive home. You will need a responsible individual to drive you home and stay with you for 24 hours after surgery.   If you are taking public  transportation, you will need to have a responsible individual with you.  Please call the Pre-admissions Testing Dept. at [phone] if you have any questions about these instructions.  Surgery Visitation Policy:  Patients having surgery or a procedure may have two visitors.  Children under the age of 76 must have an adult with them who is not the patient.  Inpatient Visitation:    Visiting hours are 7 a.m. to 8 p.m. Up to four visitors are allowed at one time in a patient room. The visitors may rotate out with other people during the day.  One visitor age 48 or older may stay with the patient overnight and must be in the room by 8 p.m.

## 2023-01-22 ENCOUNTER — Telehealth: Payer: Self-pay | Admitting: Emergency Medicine

## 2023-01-22 NOTE — Telephone Encounter (Signed)
Called and spoke with patient to inform her to arrive at 830 AM Monday morning for surgery.  Pt verbalized understanding.  Asked about her number and significant others not working and she reports her number is correct and phone was dead and her significant others number has changed to the number dialed.  Updated in chart.

## 2023-01-25 ENCOUNTER — Other Ambulatory Visit: Payer: Self-pay

## 2023-01-25 ENCOUNTER — Inpatient Hospital Stay: Payer: Medicaid Other

## 2023-01-25 ENCOUNTER — Inpatient Hospital Stay
Admission: RE | Admit: 2023-01-25 | Discharge: 2023-01-27 | DRG: 026 | Disposition: A | Discharge status: Home / Self Care | Payer: Medicaid Other | Attending: Neurosurgery | Admitting: Neurosurgery

## 2023-01-25 ENCOUNTER — Inpatient Hospital Stay: Payer: Medicaid Other | Admitting: Urgent Care

## 2023-01-25 ENCOUNTER — Encounter
Admission: RE | Disposition: A | Discharge status: Home / Self Care | Payer: Self-pay | Source: Home / Self Care | Attending: Neurosurgery

## 2023-01-25 ENCOUNTER — Encounter: Payer: Self-pay | Admitting: Neurosurgery

## 2023-01-25 DIAGNOSIS — I471 Supraventricular tachycardia, unspecified: Secondary | ICD-10-CM | POA: Diagnosis not present

## 2023-01-25 DIAGNOSIS — E878 Other disorders of electrolyte and fluid balance, not elsewhere classified: Secondary | ICD-10-CM | POA: Diagnosis present

## 2023-01-25 DIAGNOSIS — R569 Unspecified convulsions: Secondary | ICD-10-CM | POA: Diagnosis present

## 2023-01-25 DIAGNOSIS — G40909 Epilepsy, unspecified, not intractable, without status epilepticus: Secondary | ICD-10-CM | POA: Diagnosis present

## 2023-01-25 DIAGNOSIS — R112 Nausea with vomiting, unspecified: Secondary | ICD-10-CM | POA: Diagnosis not present

## 2023-01-25 DIAGNOSIS — F17210 Nicotine dependence, cigarettes, uncomplicated: Secondary | ICD-10-CM | POA: Diagnosis present

## 2023-01-25 DIAGNOSIS — Z825 Family history of asthma and other chronic lower respiratory diseases: Secondary | ICD-10-CM | POA: Diagnosis not present

## 2023-01-25 DIAGNOSIS — Z01818 Encounter for other preprocedural examination: Secondary | ICD-10-CM

## 2023-01-25 DIAGNOSIS — R748 Abnormal levels of other serum enzymes: Secondary | ICD-10-CM | POA: Diagnosis present

## 2023-01-25 DIAGNOSIS — Z8249 Family history of ischemic heart disease and other diseases of the circulatory system: Secondary | ICD-10-CM | POA: Diagnosis not present

## 2023-01-25 DIAGNOSIS — Z9152 Personal history of nonsuicidal self-harm: Secondary | ICD-10-CM

## 2023-01-25 DIAGNOSIS — R519 Headache, unspecified: Secondary | ICD-10-CM | POA: Diagnosis not present

## 2023-01-25 DIAGNOSIS — R4182 Altered mental status, unspecified: Secondary | ICD-10-CM | POA: Diagnosis not present

## 2023-01-25 DIAGNOSIS — D496 Neoplasm of unspecified behavior of brain: Principal | ICD-10-CM | POA: Diagnosis present

## 2023-01-25 DIAGNOSIS — G40901 Epilepsy, unspecified, not intractable, with status epilepticus: Secondary | ICD-10-CM | POA: Diagnosis not present

## 2023-01-25 DIAGNOSIS — F17290 Nicotine dependence, other tobacco product, uncomplicated: Secondary | ICD-10-CM | POA: Diagnosis present

## 2023-01-25 DIAGNOSIS — Z818 Family history of other mental and behavioral disorders: Secondary | ICD-10-CM | POA: Diagnosis not present

## 2023-01-25 HISTORY — PX: APPLICATION OF CRANIAL NAVIGATION: SHX6578

## 2023-01-25 HISTORY — PX: CRANIOTOMY: SHX93

## 2023-01-25 LAB — POCT PREGNANCY, URINE: Preg Test, Ur: NEGATIVE

## 2023-01-25 LAB — GLUCOSE, CAPILLARY: Glucose-Capillary: 123 mg/dL — ABNORMAL HIGH (ref 70–99)

## 2023-01-25 LAB — MRSA NEXT GEN BY PCR, NASAL: MRSA by PCR Next Gen: NOT DETECTED

## 2023-01-25 LAB — ABO/RH: ABO/RH(D): O POS

## 2023-01-25 SURGERY — CRANIOTOMY TUMOR EXCISION
Anesthesia: General | Laterality: Right

## 2023-01-25 MED ORDER — ORAL CARE MOUTH RINSE: 15.0000 mL | OROMUCOSAL | Status: DC | PRN

## 2023-01-25 MED ORDER — GELATIN ABSORBABLE 12-7 MM EX MISC
CUTANEOUS | Status: AC
  Filled 2023-01-25: qty 1

## 2023-01-25 MED ORDER — DOCUSATE SODIUM 100 MG PO CAPS
100.0000 mg | ORAL_CAPSULE | Freq: Two times a day (BID) | ORAL | Status: DC
  Administered 2023-01-25 – 2023-01-27 (×4): 100 mg via ORAL
  Filled 2023-01-25 (×4): qty 1

## 2023-01-25 MED ORDER — LACTATED RINGERS IV SOLN: INTRAVENOUS | Status: DC

## 2023-01-25 MED ORDER — ACETAMINOPHEN 10 MG/ML IV SOLN: 15.0000 mg/kg | Freq: Once | INTRAVENOUS | Status: DC | PRN

## 2023-01-25 MED ORDER — ACETAMINOPHEN 10 MG/ML IV SOLN
INTRAVENOUS | Status: AC
  Filled 2023-01-25: qty 100

## 2023-01-25 MED ORDER — DROPERIDOL 2.5 MG/ML IJ SOLN
INTRAMUSCULAR | Status: AC
  Filled 2023-01-25: qty 2

## 2023-01-25 MED ORDER — THROMBIN 5000 UNITS EX SOLR
CUTANEOUS | Status: AC
  Filled 2023-01-25: qty 5000

## 2023-01-25 MED ORDER — OXYCODONE HCL 5 MG PO TABS: 5.0000 mg | ORAL_TABLET | Freq: Once | ORAL | Status: DC | PRN

## 2023-01-25 MED ORDER — SUCCINYLCHOLINE CHLORIDE 200 MG/10ML IV SOSY
PREFILLED_SYRINGE | INTRAVENOUS | Status: DC | PRN
  Administered 2023-01-25: 60 mg via INTRAVENOUS

## 2023-01-25 MED ORDER — DEXAMETHASONE SODIUM PHOSPHATE 4 MG/ML IJ SOLN
4.0000 mg | Freq: Three times a day (TID) | INTRAMUSCULAR | Status: DC
  Administered 2023-01-26 – 2023-01-27 (×2): 4 mg via INTRAVENOUS
  Filled 2023-01-25 (×2): qty 1

## 2023-01-25 MED ORDER — REMIFENTANIL HCL 1 MG IV SOLR
INTRAVENOUS | Status: DC | PRN
  Administered 2023-01-25: .15 ug/kg/min via INTRAVENOUS

## 2023-01-25 MED ORDER — DEXAMETHASONE SODIUM PHOSPHATE 10 MG/ML IJ SOLN
INTRAMUSCULAR | Status: DC | PRN
  Administered 2023-01-25: 10 mg via INTRAVENOUS

## 2023-01-25 MED ORDER — DEXAMETHASONE SODIUM PHOSPHATE 10 MG/ML IJ SOLN
6.0000 mg | Freq: Four times a day (QID) | INTRAMUSCULAR | Status: AC
  Administered 2023-01-25 – 2023-01-26 (×2): 6 mg via INTRAVENOUS
  Filled 2023-01-25 (×2): qty 0.6

## 2023-01-25 MED ORDER — HYDROMORPHONE HCL 1 MG/ML IJ SOLN
INTRAMUSCULAR | Status: AC
  Filled 2023-01-25: qty 1

## 2023-01-25 MED ORDER — SODIUM CHLORIDE 0.9 % IV SOLN: INTRAVENOUS | Status: DC | PRN

## 2023-01-25 MED ORDER — NALOXONE HCL 0.4 MG/ML IJ SOLN: 0.0800 mg | INTRAMUSCULAR | Status: DC | PRN

## 2023-01-25 MED ORDER — ORAL CARE MOUTH RINSE: 15.0000 mL | Freq: Once | OROMUCOSAL | Status: AC

## 2023-01-25 MED ORDER — DROPERIDOL 2.5 MG/ML IJ SOLN
0.6250 mg | Freq: Once | INTRAMUSCULAR | Status: AC
  Administered 2023-01-25: 0.625 mg via INTRAVENOUS

## 2023-01-25 MED ORDER — FENTANYL CITRATE (PF) 100 MCG/2ML IJ SOLN
INTRAMUSCULAR | Status: AC
  Filled 2023-01-25: qty 2

## 2023-01-25 MED ORDER — LIDOCAINE-EPINEPHRINE 1 %-1:100000 IJ SOLN
INTRAMUSCULAR | Status: DC | PRN
  Administered 2023-01-25: 10 mL

## 2023-01-25 MED ORDER — MORPHINE SULFATE (PF) 2 MG/ML IV SOLN
1.0000 mg | INTRAVENOUS | Status: DC | PRN
  Administered 2023-01-25 – 2023-01-26 (×2): 1 mg via INTRAVENOUS
  Filled 2023-01-25 (×2): qty 1

## 2023-01-25 MED ORDER — ACETAMINOPHEN 650 MG RE SUPP: 650.0000 mg | RECTAL | Status: DC | PRN

## 2023-01-25 MED ORDER — CEFAZOLIN SODIUM-DEXTROSE 2-4 GM/100ML-% IV SOLN
INTRAVENOUS | Status: AC
  Filled 2023-01-25: qty 100

## 2023-01-25 MED ORDER — CHLORHEXIDINE GLUCONATE 0.12 % MT SOLN
OROMUCOSAL | Status: AC
  Filled 2023-01-25: qty 15

## 2023-01-25 MED ORDER — LAMOTRIGINE 25 MG PO TABS
125.0000 mg | ORAL_TABLET | Freq: Two times a day (BID) | ORAL | Status: DC
  Administered 2023-01-25 – 2023-01-27 (×4): 125 mg via ORAL
  Filled 2023-01-25 (×4): qty 5

## 2023-01-25 MED ORDER — 0.9 % SODIUM CHLORIDE (POUR BTL) OPTIME
TOPICAL | Status: DC | PRN
  Administered 2023-01-25: 500 mL

## 2023-01-25 MED ORDER — ENOXAPARIN SODIUM 40 MG/0.4ML IJ SOSY
40.0000 mg | PREFILLED_SYRINGE | INTRAMUSCULAR | Status: DC
  Administered 2023-01-26 – 2023-01-27 (×2): 40 mg via SUBCUTANEOUS
  Filled 2023-01-25 (×2): qty 0.4

## 2023-01-25 MED ORDER — HYDROMORPHONE HCL 1 MG/ML IJ SOLN
INTRAMUSCULAR | Status: DC | PRN
  Administered 2023-01-25 (×2): .5 mg via INTRAVENOUS

## 2023-01-25 MED ORDER — MANNITOL 20 % IV SOLN: INTRAVENOUS | Status: DC | PRN

## 2023-01-25 MED ORDER — LIDOCAINE-EPINEPHRINE 1 %-1:100000 IJ SOLN
INTRAMUSCULAR | Status: AC
Start: 2023-01-25 — End: ?
  Filled 2023-01-25: qty 1

## 2023-01-25 MED ORDER — PHENYLEPHRINE HCL-NACL 20-0.9 MG/250ML-% IV SOLN
INTRAVENOUS | Status: DC | PRN
  Administered 2023-01-25: 25 ug/min via INTRAVENOUS

## 2023-01-25 MED ORDER — GLYCOPYRROLATE 0.2 MG/ML IJ SOLN
INTRAMUSCULAR | Status: DC | PRN
  Administered 2023-01-25: .2 mg via INTRAVENOUS

## 2023-01-25 MED ORDER — ACETAMINOPHEN 10 MG/ML IV SOLN
INTRAVENOUS | Status: DC | PRN
  Administered 2023-01-25: 1000 mg via INTRAVENOUS

## 2023-01-25 MED ORDER — OXYCODONE HCL 5 MG/5ML PO SOLN: 5.0000 mg | Freq: Once | ORAL | Status: DC | PRN

## 2023-01-25 MED ORDER — DEXAMETHASONE SODIUM PHOSPHATE 4 MG/ML IJ SOLN
4.0000 mg | Freq: Four times a day (QID) | INTRAMUSCULAR | Status: AC
Start: 2023-01-26 — End: 2023-01-26
  Administered 2023-01-26 (×2): 4 mg via INTRAVENOUS
  Filled 2023-01-25 (×2): qty 1

## 2023-01-25 MED ORDER — PROMETHAZINE HCL 25 MG PO TABS: 12.5000 mg | ORAL_TABLET | ORAL | Status: DC | PRN

## 2023-01-25 MED ORDER — MAGNESIUM CITRATE PO SOLN: 1.0000 | Freq: Once | ORAL | Status: DC | PRN

## 2023-01-25 MED ORDER — MIDAZOLAM HCL 2 MG/2ML IJ SOLN
INTRAMUSCULAR | Status: AC
  Filled 2023-01-25: qty 2

## 2023-01-25 MED ORDER — BACITRACIN ZINC 500 UNIT/GM EX OINT
TOPICAL_OINTMENT | CUTANEOUS | Status: AC
  Filled 2023-01-25: qty 28.35

## 2023-01-25 MED ORDER — SODIUM CHLORIDE (PF) 0.9 % IJ SOLN
INTRAMUSCULAR | Status: AC
Start: 2023-01-25 — End: ?
  Filled 2023-01-25: qty 50

## 2023-01-25 MED ORDER — PHENYLEPHRINE HCL-NACL 20-0.9 MG/250ML-% IV SOLN
INTRAVENOUS | Status: AC
  Filled 2023-01-25: qty 250

## 2023-01-25 MED ORDER — SODIUM CHLORIDE 0.9 % IV SOLN
INTRAVENOUS | Status: DC | PRN
  Administered 2023-01-25: 1000 mg via INTRAVENOUS

## 2023-01-25 MED ORDER — FENTANYL CITRATE (PF) 100 MCG/2ML IJ SOLN: 25.0000 ug | INTRAMUSCULAR | Status: DC | PRN

## 2023-01-25 MED ORDER — PROPOFOL 10 MG/ML IV BOLUS
INTRAVENOUS | Status: DC | PRN
  Administered 2023-01-25: 10 mg via INTRAVENOUS
  Administered 2023-01-25: 20 mg via INTRAVENOUS
  Administered 2023-01-25: 200 mg via INTRAVENOUS

## 2023-01-25 MED ORDER — ONDANSETRON HCL 4 MG PO TABS
4.0000 mg | ORAL_TABLET | ORAL | Status: DC | PRN
  Administered 2023-01-27: 4 mg via ORAL
  Filled 2023-01-25: qty 1

## 2023-01-25 MED ORDER — ACETAMINOPHEN 325 MG PO TABS
650.0000 mg | ORAL_TABLET | ORAL | Status: DC | PRN
  Filled 2023-01-25 (×2): qty 2

## 2023-01-25 MED ORDER — REMIFENTANIL HCL 1 MG IV SOLR
INTRAVENOUS | Status: AC
  Filled 2023-01-25: qty 1000

## 2023-01-25 MED ORDER — LIDOCAINE HCL (CARDIAC) PF 100 MG/5ML IV SOSY
PREFILLED_SYRINGE | INTRAVENOUS | Status: DC | PRN
  Administered 2023-01-25: 60 mg via INTRAVENOUS

## 2023-01-25 MED ORDER — SURGIFLO WITH THROMBIN (HEMOSTATIC MATRIX KIT) OPTIME
TOPICAL | Status: DC | PRN
  Administered 2023-01-25: 1 via TOPICAL

## 2023-01-25 MED ORDER — POLYETHYLENE GLYCOL 3350 17 G PO PACK: 17.0000 g | PACK | Freq: Every day | ORAL | Status: DC | PRN

## 2023-01-25 MED ORDER — FENTANYL CITRATE (PF) 100 MCG/2ML IJ SOLN
INTRAMUSCULAR | Status: DC | PRN
  Administered 2023-01-25 (×3): 50 ug via INTRAVENOUS

## 2023-01-25 MED ORDER — ONDANSETRON HCL 4 MG/2ML IJ SOLN
INTRAMUSCULAR | Status: DC | PRN
  Administered 2023-01-25 (×2): 4 mg via INTRAVENOUS

## 2023-01-25 MED ORDER — CHLORHEXIDINE GLUCONATE CLOTH 2 % EX PADS
6.0000 | MEDICATED_PAD | Freq: Every day | CUTANEOUS | Status: DC
  Administered 2023-01-26: 6 via TOPICAL

## 2023-01-25 MED ORDER — CEFAZOLIN IN SODIUM CHLORIDE 2-0.9 GM/100ML-% IV SOLN
2.0000 g | Freq: Once | INTRAVENOUS | Status: AC
Start: 2023-01-25 — End: 2023-01-25
  Administered 2023-01-25: 2 g via INTRAVENOUS
  Administered 2023-01-25: 1 g via INTRAVENOUS
  Filled 2023-01-25: qty 100

## 2023-01-25 MED ORDER — LABETALOL HCL 5 MG/ML IV SOLN: 10.0000 mg | INTRAVENOUS | Status: DC | PRN

## 2023-01-25 MED ORDER — GELATIN ABSORBABLE 12-7 MM EX MISC
CUTANEOUS | Status: AC
  Filled 2023-01-25: qty 2

## 2023-01-25 MED ORDER — CHLORHEXIDINE GLUCONATE 0.12 % MT SOLN
15.0000 mL | Freq: Once | OROMUCOSAL | Status: AC
  Administered 2023-01-25: 15 mL via OROMUCOSAL

## 2023-01-25 MED ORDER — SORBITOL 70 % SOLN: 30.0000 mL | Freq: Every day | Status: DC | PRN

## 2023-01-25 MED ORDER — HYDROCODONE-ACETAMINOPHEN 5-325 MG PO TABS
1.0000 | ORAL_TABLET | ORAL | Status: DC | PRN
  Administered 2023-01-25: 1 via ORAL
  Filled 2023-01-25: qty 1

## 2023-01-25 MED ORDER — ONDANSETRON HCL 4 MG/2ML IJ SOLN: 4.0000 mg | Freq: Once | INTRAMUSCULAR | Status: DC | PRN

## 2023-01-25 MED ORDER — ONDANSETRON HCL 4 MG/2ML IJ SOLN
4.0000 mg | INTRAMUSCULAR | Status: DC | PRN
  Administered 2023-01-25 – 2023-01-26 (×2): 4 mg via INTRAVENOUS
  Filled 2023-01-25 (×3): qty 2

## 2023-01-25 MED ORDER — BACITRACIN 500 UNIT/GM EX OINT
TOPICAL_OINTMENT | CUTANEOUS | Status: DC | PRN
  Administered 2023-01-25 (×2): 1 via TOPICAL

## 2023-01-25 MED ORDER — GELATIN ABSORBABLE 12-7 MM EX MISC
CUTANEOUS | Status: DC | PRN
  Administered 2023-01-25: 1 via TOPICAL

## 2023-01-25 MED ORDER — MIDAZOLAM HCL 2 MG/2ML IJ SOLN
INTRAMUSCULAR | Status: DC | PRN
  Administered 2023-01-25: 2 mg via INTRAVENOUS

## 2023-01-25 MED ORDER — SENNA 8.6 MG PO TABS
1.0000 | ORAL_TABLET | Freq: Two times a day (BID) | ORAL | Status: DC
  Administered 2023-01-25 – 2023-01-26 (×3): 8.6 mg via ORAL
  Filled 2023-01-25 (×3): qty 1

## 2023-01-25 MED ORDER — BUSPIRONE HCL 10 MG PO TABS
15.0000 mg | ORAL_TABLET | Freq: Every day | ORAL | Status: DC
  Administered 2023-01-25: 15 mg via ORAL
  Filled 2023-01-25: qty 2

## 2023-01-25 SURGICAL SUPPLY — 69 items
BASIN GRAD PLASTIC 32OZ STRL (MISCELLANEOUS) ×2 IMPLANT
BASIN KIT SINGLE STR (MISCELLANEOUS) ×2 IMPLANT
BLADE CLIPPER SPEC (BLADE) ×2 IMPLANT
BNDG GAUZE DERMACEA FLUFF 4 (GAUZE/BANDAGES/DRESSINGS) ×6 IMPLANT
BRUSH SCRUB EZ 4% CHG (MISCELLANEOUS) ×2 IMPLANT
BUR ACORN 7.5 PRECISION (BURR) ×2 IMPLANT
BUR SPIRAL ROUTER 2.3 (BUR) ×2 IMPLANT
CASSETTE SUCT IRRIG SONOPET IQ (MISCELLANEOUS) IMPLANT
CHLORAPREP W/TINT 26 (MISCELLANEOUS) ×4 IMPLANT
CNTNR URN SCR LID CUP LEK RST (MISCELLANEOUS) ×4 IMPLANT
COUNTER NEEDLE 20/40 LG (NEEDLE) ×2 IMPLANT
COVERAGE SUPPORT SPINE BRAINLB (MISCELLANEOUS) ×2
DRAPE SURG 17X11 SM STRL (DRAPES) ×8 IMPLANT
DRAPE WARM FLUID 44X44 (DRAPES) ×2 IMPLANT
DRSG TEGADERM 4X10 (GAUZE/BANDAGES/DRESSINGS) IMPLANT
DRSG TEGADERM 4X4.75 (GAUZE/BANDAGES/DRESSINGS) IMPLANT
DRSG TELFA 3X4 N-ADH STERILE (GAUZE/BANDAGES/DRESSINGS) ×4 IMPLANT
ELECT CAUTERY BLADE TIP 2.5 (TIP) ×2
ELECT REM PT RETURN 9FT ADLT (ELECTROSURGICAL) ×2
ELECTRODE CAUTERY BLDE TIP 2.5 (TIP) ×2 IMPLANT
ELECTRODE REM PT RTRN 9FT ADLT (ELECTROSURGICAL) ×2 IMPLANT
EX-PIN ORTHOLOCK NAV 4X150 (PIN) IMPLANT
FEE CVG SUPP BRAINLAB NG SPNE (MISCELLANEOUS) ×2 IMPLANT
GAUZE SPONGE 4X4 12PLY STRL (GAUZE/BANDAGES/DRESSINGS) ×6 IMPLANT
GAUZE XEROFORM 1X8 LF (GAUZE/BANDAGES/DRESSINGS) IMPLANT
GLOVE BIOGEL PI IND STRL 6.5 (GLOVE) ×2 IMPLANT
GLOVE SURG SYN 6.5 ES PF (GLOVE) ×4
GLOVE SURG SYN 6.5 PF PI (GLOVE) ×4 IMPLANT
GLOVE SURG SYN 8.5 E (GLOVE) ×6
GLOVE SURG SYN 8.5 PF PI (GLOVE) ×6 IMPLANT
GOWN SRG LRG LVL 4 IMPRV REINF (GOWNS) ×2 IMPLANT
GOWN SRG XL LVL 3 NONREINFORCE (GOWNS) ×2 IMPLANT
GRADUATE 1200CC STRL 31836 (MISCELLANEOUS) ×2 IMPLANT
GRAFT DURAGEN MATRIX 2WX2L IMPLANT
HEMOSTAT SURGICEL 2X14 (HEMOSTASIS) ×2 IMPLANT
HOLDER FOLEY CATH W/STRAP (MISCELLANEOUS) ×2 IMPLANT
HOOK STAY BLUNT/RETRACTOR 5M (MISCELLANEOUS) IMPLANT
KIT TURNOVER KIT A (KITS) ×2 IMPLANT
MANIFOLD NEPTUNE II (INSTRUMENTS) ×2 IMPLANT
MARKER SKIN DUAL TIP RULER LAB (MISCELLANEOUS) ×4 IMPLANT
MARKER SPHERE PSV REFLC 13MM (MARKER) ×14 IMPLANT
MAT ABSORB FLUID 56X50 GRAY (MISCELLANEOUS) ×2 IMPLANT
NDL HYPO 22X1.5 SAFETY MO (MISCELLANEOUS) ×2 IMPLANT
NEEDLE HYPO 22X1.5 SAFETY MO (MISCELLANEOUS) ×2
NS IRRIG 1000ML POUR BTL (IV SOLUTION) ×4 IMPLANT
PACK CRANIOTOMY CUSTOM (CUSTOM PROCEDURE TRAY) ×2 IMPLANT
PAD ARMBOARD 7.5X6 YLW CONV (MISCELLANEOUS) ×6 IMPLANT
PIN MAYFIELD SKULL DISP (PIN) ×2 IMPLANT
PLATE 1.5/0.5 13MM BURR HOLE (Plate) IMPLANT
SCREW SELF DRILL HT 1.5/4MM (Screw) IMPLANT
SHEET NEURO XL SOL CTL (MISCELLANEOUS) ×2 IMPLANT
SOCK SONOPET SPECIMEN (MISCELLANEOUS) IMPLANT
SOL PREP PVP 2OZ (MISCELLANEOUS) ×2
SOL SCRUB PVP POV-IOD 4OZ 7.5% (MISCELLANEOUS) ×2
SOLUTION PREP PVP 2OZ (MISCELLANEOUS) ×2 IMPLANT
SOLUTION SCRB POV-IOD 4OZ 7.5% (MISCELLANEOUS) ×2 IMPLANT
STAPLER SKIN PROX 35W (STAPLE) ×4 IMPLANT
SURGIFLO W/THROMBIN 8M KIT (HEMOSTASIS) ×2 IMPLANT
SURGILUBE 2OZ TUBE FLIPTOP (MISCELLANEOUS) IMPLANT
SUT ETH BLK MONO 3 0 FS 1 12/B (SUTURE) IMPLANT
SUT MNCRL 3-0 UNDYED SH (SUTURE) ×2 IMPLANT
SUT NURALON 4 0 TR CR/8 (SUTURE) IMPLANT
SUT VIC AB 2-0 CT1 18 (SUTURE) ×6 IMPLANT
TAPE CLOTH 3X10 WHT NS LF (GAUZE/BANDAGES/DRESSINGS) ×6 IMPLANT
TIP TISSUE SONOPET IQ STD 12 (TIP) IMPLANT
TOWEL OR 17X26 4PK STRL BLUE (TOWEL DISPOSABLE) ×6 IMPLANT
TRAP FLUID SMOKE EVACUATOR (MISCELLANEOUS) ×2 IMPLANT
TRAY FOLEY SLVR 16FR LF STAT (SET/KITS/TRAYS/PACK) ×2 IMPLANT
WATER STERILE IRR 1000ML POUR (IV SOLUTION) ×2 IMPLANT

## 2023-01-25 NOTE — Anesthesia Preprocedure Evaluation (Addendum)
Anesthesia Evaluation  Patient identified by MRN, date of birth, ID band Patient awake    Reviewed: Allergy & Precautions, NPO status , Patient's Chart, lab work & pertinent test results  History of Anesthesia Complications Negative for: history of anesthetic complications  Airway Mallampati: I   Neck ROM: Full    Dental no notable dental hx.    Pulmonary Current Smoker (occasional) and Patient abstained from smoking. Daily vaping   Pulmonary exam normal breath sounds clear to auscultation       Cardiovascular Normal cardiovascular exam Rhythm:Regular Rate:Normal  Innocent murmur   ECG 09/25/22: Sinus tachycardia; rightward axis   Neuro/Psych Seizures - (last sz 01/20/23),  PSYCHIATRIC DISORDERS Anxiety Depression    Hx alcohol use disorder, none in past month    GI/Hepatic negative GI ROS,,,  Endo/Other  negative endocrine ROS    Renal/GU negative Renal ROS     Musculoskeletal   Abdominal   Peds  Hematology  (+) Blood dyscrasia, anemia   Anesthesia Other Findings   Reproductive/Obstetrics                             Anesthesia Physical Anesthesia Plan  ASA: 2  Anesthesia Plan: General   Post-op Pain Management:    Induction: Intravenous  PONV Risk Score and Plan: 2 and Ondansetron, Dexamethasone and Treatment may vary due to age or medical condition  Airway Management Planned: Oral ETT  Additional Equipment: Arterial line  Intra-op Plan:   Post-operative Plan: Extubation in OR  Informed Consent: I have reviewed the patients History and Physical, chart, labs and discussed the procedure including the risks, benefits and alternatives for the proposed anesthesia with the patient or authorized representative who has indicated his/her understanding and acceptance.     Dental advisory given  Plan Discussed with: CRNA  Anesthesia Plan Comments: (Patient consented for risks of  anesthesia including but not limited to:  - adverse reactions to medications - damage to eyes, teeth, lips or other oral mucosa - nerve damage due to positioning  - sore throat or hoarseness - damage to heart, brain, nerves, lungs, other parts of body or loss of life  Informed patient about role of CRNA in peri- and intra-operative care.  Patient voiced understanding.)        Anesthesia Quick Evaluation

## 2023-01-25 NOTE — Anesthesia Procedure Notes (Addendum)
Procedure Name: Intubation Date/Time: 01/25/2023 12:22 PM  Performed by: Mohammed Kindle, CRNAPre-anesthesia Checklist: Patient identified, Emergency Drugs available, Suction available and Patient being monitored Patient Re-evaluated:Patient Re-evaluated prior to induction Oxygen Delivery Method: Circle system utilized Preoxygenation: Pre-oxygenation with 100% oxygen Induction Type: IV induction Ventilation: Mask ventilation without difficulty Laryngoscope Size: McGrath and 3 Grade View: Grade I Tube type: Oral Tube size: 6.5 mm Number of attempts: 1 Airway Equipment and Method: Stylet Placement Confirmation: ETT inserted through vocal cords under direct vision, positive ETCO2, breath sounds checked- equal and bilateral and CO2 detector Secured at: 21 cm Tube secured with: Tape Dental Injury: Teeth and Oropharynx as per pre-operative assessment

## 2023-01-25 NOTE — Anesthesia Procedure Notes (Signed)
Arterial Line Insertion Start/End12/03/2022 12:30 PM, 01/25/2023 12:38 PM Performed by: Reed Breech, MD, anesthesiologist  Patient location: OR. Preanesthetic checklist: patient identified, IV checked, site marked, risks and benefits discussed, surgical consent, monitors and equipment checked, pre-op evaluation, timeout performed and anesthesia consent Patient sedated Right, radial was placed Catheter size: 20 G Hand hygiene performed  and maximum sterile barriers used   Attempts: 2 Procedure performed using ultrasound guided technique. Ultrasound Notes:anatomy identified, needle tip was noted to be adjacent to the nerve/plexus identified and no ultrasound evidence of intravascular and/or intraneural injection Following insertion, dressing applied and Biopatch. Post procedure assessment: normal and unchanged  Patient tolerated the procedure well with no immediate complications. Additional procedure comments: Initial attempt on left radial, unable to thread wire.  Second attempt on right radial successful.  Ultrasound used for both attempts.Marland Kitchen

## 2023-01-25 NOTE — Anesthesia Postprocedure Evaluation (Signed)
Anesthesia Post Note  Patient: Diane Kelly  Procedure(s) Performed: RIGHT CRANIOTOMY FOR TUMOR RESECTION (Right) APPLICATION OF CRANIAL NAVIGATION  Patient location during evaluation: PACU Anesthesia Type: General Level of consciousness: awake and alert Pain management: pain level controlled Vital Signs Assessment: post-procedure vital signs reviewed and stable Respiratory status: spontaneous breathing, nonlabored ventilation, respiratory function stable and patient connected to nasal cannula oxygen Cardiovascular status: blood pressure returned to baseline and stable Postop Assessment: no apparent nausea or vomiting Anesthetic complications: no  No notable events documented.   Last Vitals:  Vitals:   01/25/23 2230 01/25/23 2300  BP:  (!) 88/43  Pulse: 62 (!) 58  Resp:    Temp:    SpO2: 98% 98%    Last Pain:  Vitals:   01/25/23 2300  TempSrc:   PainSc: 4                  Stephanie Coup

## 2023-01-25 NOTE — Transfer of Care (Signed)
Immediate Anesthesia Transfer of Care Note  Patient: Diane Kelly  Procedure(s) Performed: RIGHT CRANIOTOMY FOR TUMOR RESECTION (Right) APPLICATION OF CRANIAL NAVIGATION  Patient Location: PACU  Anesthesia Type:General  Level of Consciousness: awake, drowsy, and patient cooperative  Airway & Oxygen Therapy: Patient Spontanous Breathing and Patient connected to nasal cannula oxygen  Post-op Assessment: Report given to RN and Post -op Vital signs reviewed and stable  Post vital signs: Reviewed and stable  Last Vitals:  Vitals Value Taken Time  BP 102/55 01/25/23 1615  Temp 36.6 C 01/25/23 1608  Pulse 71 01/25/23 1618  Resp 13 01/25/23 1618  SpO2 99 % 01/25/23 1618  Vitals shown include unfiled device data.  Last Pain:  Vitals:   01/25/23 1608  TempSrc:   PainSc: Asleep         Complications: No notable events documented.

## 2023-01-25 NOTE — Op Note (Signed)
Indications: The patient is a 22yo female who presented with a brain tumor.  Due to growth and continued symptoms, surgical resection was recommended.   Findings: brain tumor  Preoperative Diagnosis: brain tumor Postoperative Diagnosis: same   EBL: 50 ml IVF: see anesthesia record Drains: none Disposition: Extubated and Stable to PACU Complications: none  A foley catheter was placed.   Preoperative Note:   Risks of surgery discussed include: infection, bleeding, stroke, coma, death, paralysis, CSF leak, nerve/spinal cord injury, numbness, tingling, weakness, vascular injury, need for further surgery, persistent symptoms, and the risks of anesthesia. The patient understood these risks and agreed to proceed.  NAME OF PROCEDURE:               1. Right Temporal Craniotomy for Tumor Resection 2. Use of stereotaxis for planning  PROCEDURE:  Patient was brought to the operating room, intubated. The mayfield pins were applied.  The patient was then positioned for a right-sided temporal craniotomy.  The stereotactic images were registered to the patient's skin so that it could be used for planning.  Mannitol, dexamethasone, and keppra were given.  The incision was planned, then prepped and draped in standard fashion.  The incision was opened sharply, then the galea opened.  A retractor was placed.  The temporalis muscle was divided, then the periosteal used to reflect the muscle.  The craniotomy site was re-confirmed with stereotaxis.  A temporal craniotomy was then fashioned with the burr and craniotome.  The dura was identified, then opened sharply.  The brain was visualized, and stereotactic guidance used to determine the cortical entry site and extent of cortical involvement.  The cortex was coagulated and entered.  The arachnoid barrier between the superior and middle temporal gyri was divided and the plane between the 2 gyri was developed.  The base of the sulcus was identified, then the  superior temporal gyrus was protected.   The posterior extent of the lesion was identified using stereotaxis.  The dissection plane was then carried posteriorly until the abnormal gyrus was completely dissected free.  The inferior temporal gyrus was also potentially involved.  After the posterior extent of the lesion was identified, the brain tissue was divided posteriorly and the arachnoid divided.  Anteriorly, the superior and middle temporal gyri were separated.  At the base of the sulcus, the right inferior middle temporal gyri were then divided from the remainder of the more normal-appearing tissue and handed off for biopsy.  The different borders of the resection cavity were identified using visual inspection and the stereotactic system.  Using the ultrasonic aspirator, a small margin around the imaging abnormality was removed.  After the resection was complete, the intradural space was irrigated and hemostasis achieved.  We then closed the dura using interrupted 4-0 Nurolon sutures.  DuraGen was used to augment closure.  The bone flap was replaced using the Lorenz plating system.  The temporalis and galea were closed.  A running monocryl was used on the skin.  A sterile dressing was placed.    Needle, lap and all counts were correct at the end of the case.    Manning Charity PA assisted in the procedure. An assistant was required for this procedure due to the complexity.  The assistant provided assistance in tissue manipulation and suction, and was required for the successful and safe performance of the procedure. I performed the critical portions of the procedure.   Venetia Night MD Neurosurgery  The following cranial fixation plates were used: [id number]  We used 5 total 4 mm screws.

## 2023-01-25 NOTE — H&P (Signed)
Referring Physician:  No referring provider defined for this encounter.  Primary Physician:  Alm Bustard, NP  History of Present Illness: 01/25/2023 [name] presents today for surgical intervention.  12/07/2022 [name] is here today with a chief complaint of seizure disorder which is going on for many years.  She had a CT scan performed several years ago which showed evidence of a right temporal lesion.  She has been managed for her seizures for several years with different medications.  She is currently switching to lamotrigine.  She has seizures monthly, some of which generalized.  After a generalized seizure, she has a period of time where she is postictal.  She suffers from headaches during this time, as well as issues with moderation of her behavior.  She takes 1 to 2 days to return to normal.  She had a lesion found on her head CT, and ultimately was worked up with MRI scan.  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  The symptoms are causing a significant impact on the patient's life.   I have utilized the care everywhere function in epic to review the outside records available from external health systems.  Review of Systems:  A 10 point review of systems is negative, except for the pertinent positives and negatives detailed in the HPI.  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   Vapes nicotine containing substance     Past Surgical History: Past Surgical History:  Procedure Laterality Date   NO PAST SURGERIES      Allergies: Allergies as of 12/16/2022   (No Known  Allergies)    Medications:  Current Facility-Administered Medications:    ceFAZolin (ANCEF) IVPB 2g/100 mL premix, 2 g, Intravenous, Once, Venetia Night, MD   lactated ringers infusion, , Intravenous, Continuous, Pernell Dupre, Currie Paris, MD  Social History: Social History   Tobacco Use   Smoking status: Some Days    Current packs/day: 0.50    Types: Cigarettes   Smokeless tobacco: Current  Vaping Use   Vaping status: Every Day   Substances: Nicotine, Flavoring  Substance Use Topics   Alcohol use: Yes    Comment: occ   Drug use: Not Currently    Comment: cbd    Family Medical History: Family History  Problem Relation Age of Onset   Hypertension Mother    COPD Mother    COPD Maternal Grandmother    Anxiety disorder Maternal Grandmother    Sleep apnea Maternal Grandmother    COPD Maternal Grandfather    Anxiety disorder Paternal Grandmother    Asthma Paternal Grandmother    Depression Paternal Grandmother     Physical Examination: Vitals:   01/25/23 0850  BP: 119/83  Pulse: 86  Resp: 14  Temp: (!) 97.3 F (36.3 C)   Heart sounds normal no MRG. Chest Clear to Auscultation Bilaterally.  General: Patient is in no apparent distress. Attention to examination is appropriate.  Neck:   Supple.  Full range of motion.  Respiratory: Patient is  breathing without any difficulty.   NEUROLOGICAL:     Awake, alert, oriented to person, place, and time.  Speech is clear and fluent.   Cranial Nerves: Pupils equal round and reactive to light.  Facial tone is symmetric.  Facial sensation is symmetric. Shoulder shrug is symmetric. Tongue protrusion is midline.  There is no pronator drift.  VFF to confrontation  Strength: Side Biceps Triceps Deltoid Interossei Grip Wrist Ext. Wrist Flex.  R 5 5 5 5 5 5 5   L 5 5 5 5 5 5 5    Side Iliopsoas Quads Hamstring PF DF EHL  R 5 5 5 5 5 5   L 5 5 5 5 5 5    Reflexes are 1+ and symmetric at the biceps, triceps, brachioradialis,  patella and achilles.   Hoffman's is absent.   Bilateral upper and lower extremity sensation is intact to light touch.    No evidence of dysmetria noted.  Gait is normal.     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 Differential includes low-grade neoplasm (favored) or possibly an anterior temporal lobe perivascular space.     Electronically Signed   By: Lesia Hausen M.D.   On: 01/16/2022 14:26    MR Brain 12/07/2022 Similar appearance to above with slight increase on my measurement.  I have personally reviewed the images and agree with the above interpretation.  Assessment and Plan: [name] is a pleasant 22 y.o. 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  There is a small cystic component as well as a FLAIR abnormality.   I have recommended right-sided craniotomy for resection of tumor.      Josaiah Muhammed K. Myer Haff MD, [facility] Neurosurgery

## 2023-01-26 ENCOUNTER — Encounter: Payer: Self-pay | Admitting: Neurosurgery

## 2023-01-26 LAB — MAGNESIUM: Magnesium: 2.1 mg/dL (ref 1.7–2.4)

## 2023-01-26 LAB — CBC WITH DIFFERENTIAL/PLATELET
Abs Immature Granulocytes: 0.08 10*3/uL — ABNORMAL HIGH (ref 0.00–0.07)
Basophils Absolute: 0 10*3/uL (ref 0.0–0.1)
Basophils Relative: 0 %
Eosinophils Absolute: 0 10*3/uL (ref 0.0–0.5)
Eosinophils Relative: 0 %
HCT: 34.7 % — ABNORMAL LOW (ref 36.0–46.0)
Hemoglobin: 11.7 g/dL — ABNORMAL LOW (ref 12.0–15.0)
Immature Granulocytes: 1 %
Lymphocytes Relative: 5 %
Lymphs Abs: 0.6 10*3/uL — ABNORMAL LOW (ref 0.7–4.0)
MCH: 29.6 pg (ref 26.0–34.0)
MCHC: 33.7 g/dL (ref 30.0–36.0)
MCV: 87.8 fL (ref 80.0–100.0)
Monocytes Absolute: 0.4 10*3/uL (ref 0.1–1.0)
Monocytes Relative: 3 %
Neutro Abs: 12.8 10*3/uL — ABNORMAL HIGH (ref 1.7–7.7)
Neutrophils Relative %: 91 %
Platelets: 245 10*3/uL (ref 150–400)
RBC: 3.95 MIL/uL (ref 3.87–5.11)
RDW: 12.4 % (ref 11.5–15.5)
WBC: 14 10*3/uL — ABNORMAL HIGH (ref 4.0–10.5)
nRBC: 0 % (ref 0.0–0.2)

## 2023-01-26 LAB — COMPREHENSIVE METABOLIC PANEL
ALT: 117 U/L — ABNORMAL HIGH (ref 0–44)
AST: 69 U/L — ABNORMAL HIGH (ref 15–41)
Albumin: 3.8 g/dL (ref 3.5–5.0)
Alkaline Phosphatase: 57 U/L (ref 38–126)
Anion gap: 9 (ref 5–15)
BUN: 14 mg/dL (ref 6–20)
CO2: 22 mmol/L (ref 22–32)
Calcium: 8.4 mg/dL — ABNORMAL LOW (ref 8.9–10.3)
Chloride: 106 mmol/L (ref 98–111)
Creatinine, Ser: 0.68 mg/dL (ref 0.44–1.00)
GFR, Estimated: >60 mL/min (ref >60)
Glucose, Bld: 121 mg/dL — ABNORMAL HIGH (ref 70–99)
Potassium: 4 mmol/L (ref 3.5–5.1)
Sodium: 137 mmol/L (ref 135–145)
Total Bilirubin: 0.9 mg/dL (ref <1.2)
Total Protein: 6.9 g/dL (ref 6.5–8.1)

## 2023-01-26 MED ORDER — OXYCODONE HCL 5 MG PO TABS
5.0000 mg | ORAL_TABLET | ORAL | Status: DC | PRN
  Administered 2023-01-26: 5 mg via ORAL
  Filled 2023-01-26: qty 1

## 2023-01-26 MED ORDER — OXYCODONE HCL 5 MG PO TABS: 10.0000 mg | ORAL_TABLET | ORAL | Status: DC | PRN

## 2023-01-26 MED ORDER — OXYCODONE HCL 5 MG PO TABS
5.0000 mg | ORAL_TABLET | ORAL | Status: DC | PRN
Start: 2023-01-26 — End: 2023-01-26
  Administered 2023-01-26: 5 mg via ORAL
  Filled 2023-01-26: qty 1

## 2023-01-26 MED ORDER — ACETAMINOPHEN 650 MG RE SUPP
650.0000 mg | RECTAL | Status: DC | PRN
Start: 2023-01-26 — End: 2023-01-26

## 2023-01-26 MED ORDER — ACETAMINOPHEN 325 MG PO TABS: 650.0000 mg | ORAL_TABLET | ORAL | Status: DC | PRN

## 2023-01-26 MED ORDER — OXYCODONE HCL 5 MG PO TABS
2.5000 mg | ORAL_TABLET | ORAL | Status: DC | PRN
  Administered 2023-01-26 – 2023-01-27 (×4): 5 mg via ORAL
  Filled 2023-01-26 (×5): qty 1

## 2023-01-26 MED ORDER — BUTALBITAL-APAP-CAFFEINE 50-325-40 MG PO TABS
1.0000 | ORAL_TABLET | ORAL | Status: DC | PRN
  Administered 2023-01-26: 1 via ORAL
  Filled 2023-01-26: qty 1

## 2023-01-26 MED ORDER — CELECOXIB 200 MG PO CAPS
200.0000 mg | ORAL_CAPSULE | Freq: Two times a day (BID) | ORAL | Status: DC
  Administered 2023-01-26 – 2023-01-27 (×2): 200 mg via ORAL
  Filled 2023-01-26 (×2): qty 1

## 2023-01-26 NOTE — Progress Notes (Signed)
   Neurosurgery Progress Note  History: Diane Kelly is s/p right craniotomy for resection of temporal lesion  POD1: Pt experienced some nausea and vomiting overnight and expected headache.  Physical Exam: Vitals:   01/26/23 0530 01/26/23 0600  BP:  (!) 97/48  Pulse: (!) 52 (!) 58  Resp:    Temp:    SpO2: 99% 99%    Drowsy but arouses easily to voice CNI  Strength:5/5 throughout  Incision is clean dry and intact with postoperative dressing in place.  Data:  Other tests/results:  Pathology results pending  Assessment/Plan:  Diane Kelly is a 22 year old with a longstanding history of seizure disorder status post right craniotomy for resection of right temporal lesion.  - mobilize - pain control; added Fioricet for headaches -Will plan to transfer to the floor -Continue to advance diet as tolerated - DVT prophylaxis -Continue to monitor incision closely. - PTOT  Manning Charity PA-C Department of Neurosurgery

## 2023-01-26 NOTE — Evaluation (Signed)
Occupational Therapy Evaluation Patient Details Name: Diane Kelly MRN: [id number] DOB: 10-Sep-2000 Today's Date: 01/26/2023   History of Present Illness Patient is a 22 year old female with seizure disorder s/p right craniotomy for resection of right temporal lesion.   Clinical Impression   Chart reviewed to date, pt greeted in bed, agreeable to OT evaluation. PTA pt is indep in ADL/IADL except she does not drive. Pt has assist at home following dc per her report. Pt is alert and oriented x4, good immediate recall, safety awareness, awareness of deficits. Pt is performing ADL at a supervision-MOD I level. Anticipate pt will continue to improve functionally.OT will follow while admitted as appropriate. Pt is left in bed, all needs met.       If plan is discharge home, recommend the following: Assist for transportation;Assistance with cooking/housework    Functional Status Assessment  Patient has had a recent decline in their functional status and demonstrates the ability to make significant improvements in function in a reasonable and predictable amount of time.  Equipment Recommendations  None recommended by OT    Recommendations for Other Services       Precautions / Restrictions Precautions Precautions: Fall Restrictions Weight Bearing Restrictions: No      Mobility Bed Mobility Overal bed mobility: Independent                  Transfers Overall transfer level: Independent                        Balance Overall balance assessment: Needs assistance Sitting-balance support: Feet supported Sitting balance-Leahy Scale: Normal     Standing balance support: No upper extremity supported Standing balance-Leahy Scale: Good                             ADL either performed or assessed with clinical judgement   ADL Overall ADL's : Needs assistance/impaired Eating/Feeding: Set up;Sitting   Grooming: Supervision/safety;Standing;Oral care            Upper Body Dressing : Sitting;Supervision/safety   Lower Body Dressing: Supervision/safety;Sitting/lateral leans Lower Body Dressing Details (indicate cue type and reason): socks sitting on edge of bed Toilet Transfer: Supervision/safety;Ambulation Toilet Transfer Details (indicate cue type and reason): with no AD Toileting- Clothing Manipulation and Hygiene: Supervision/safety       Functional mobility during ADLs: Supervision/safety (approx 500' with no AD)       Vision Patient Visual Report: No change from baseline Vision Assessment?: Yes Tracking/Visual Pursuits: Able to track stimulus in all quads without difficulty Visual Fields: No apparent deficits     Perception         Praxis         Pertinent Vitals/Pain Pain Assessment Pain Assessment: Faces Faces Pain Scale: Hurts a little bit Pain Location: surgical site Pain Descriptors / Indicators: Discomfort Pain Intervention(s): Monitored during session     Extremity/Trunk Assessment Upper Extremity Assessment Upper Extremity Assessment: Overall WFL for tasks assessed RUE Sensation: WNL RUE Coordination: WNL LUE Sensation: WNL LUE Coordination: WNL   Lower Extremity Assessment Lower Extremity Assessment: Overall WFL for tasks assessed       Communication Communication Communication: No apparent difficulties Cueing Techniques: Verbal cues   Cognition Arousal: Alert Behavior During Therapy: WFL for tasks assessed/performed Overall Cognitive Status: Within Functional Limits for tasks assessed  General Comments: Good immediate recall, safety awareness, awareness of deficits noted on this date     General Comments  vss throughout    Exercises Other Exercises Other Exercises: edu re: role of OT, role of rehab, discharge recommendations   Shoulder Instructions      Home Living Family/patient expects to be discharged to:: Private  residence Living Arrangements: Other relatives Available Help at Discharge: Family Type of Home: House Home Access: Level entry     Home Layout: One level     Bathroom Shower/Tub: Tub/shower unit;Walk-in shower   Bathroom Toilet: Standard     Home Equipment: None          Prior Functioning/Environment Prior Level of Function : Independent/Modified Independent             Mobility Comments: does not drive. last seizure was last week. independent at baseline without device ADLs Comments: independent        OT Problem List: Decreased activity tolerance      OT Treatment/Interventions: Self-care/ADL training;Therapeutic exercise;DME and/or AE instruction;Therapeutic activities    OT Goals(Current goals can be found in the care plan section) Acute Rehab OT Goals Patient Stated Goal: keep getting stronger OT Goal Formulation: With patient Time For Goal Achievement: 02/09/23 Potential to Achieve Goals: Good ADL Goals Pt Will Perform Grooming: Independently Pt Will Perform Lower Body Dressing: Independently Pt Will Transfer to Toilet: Independently Pt Will Perform Toileting - Clothing Manipulation and hygiene: Independently Additional ADL Goal #1: Pt will pariticpate in further cognitive testing as approrpiate  OT Frequency: Min 1X/week    Co-evaluation PT/OT/SLP Co-Evaluation/Treatment: Yes Reason for Co-Treatment: Complexity of the patient's impairments (multi-system involvement) PT goals addressed during session: Mobility/safety with mobility        AM-PAC OT "6 Clicks" Daily Activity     Outcome Measure Help from another person eating meals?: None Help from another person taking care of personal grooming?: None Help from another person toileting, which includes using toliet, bedpan, or urinal?: None Help from another person bathing (including washing, rinsing, drying)?: A Little Help from another person to put on and taking off regular upper body  clothing?: None Help from another person to put on and taking off regular lower body clothing?: None 6 Click Score: 23   End of Session Nurse Communication: Mobility status  Activity Tolerance: Patient tolerated treatment well Patient left: in bed;with call bell/phone within reach  OT Visit Diagnosis: Other abnormalities of gait and mobility (R26.89)                Time: 4259-5638 OT Time Calculation (min): 23 min Charges:  OT General Charges $OT Visit: 1 Visit OT Evaluation $OT Eval Low Complexity: 1 Low Oleta Mouse, OTD OTR/L  01/26/23, 4:07 PM

## 2023-01-26 NOTE — Progress Notes (Signed)
Reached out to Dr.Yarbrough, regarding patients art line not correlating with cuff pressures, orders to take out Aline and go by cuff pressures. Will cont. to monitor.   Diane Kelly 01/26/2023

## 2023-01-26 NOTE — Progress Notes (Signed)
Patients heart rate went into 140's this am for a brief period of time approx 45 seconds. EKG done and placed in chart. OT in o work with patient and heart rate 170's briefly. Orders for labs placed per Dr. Katrinka Blazing. No additional orders at this time

## 2023-01-26 NOTE — Evaluation (Signed)
Physical Therapy Evaluation and Discharge Patient Details Name: Diane Kelly MRN: [id number] DOB: 2000-08-07 Today's Date: 01/26/2023  History of Present Illness  Patient is a 22 year old female with seizure disorder s/p right craniotomy for resection of right temporal lesion.  Clinical Impression  Patient is agreeable to PT evaluation. The patient is independent with mobility at baseline but does not drive. She lives with multiple family members. She reports mild headache pain that does not worsen with activity.  Today, the patient is independent with mobility. No focal weakness is noted. Patient ambulated 2 full laps around the ICU without difficulty, without device and with normal gait speed. Vitals were stable throughout session. No acute PT needs at this time. Recommend routine mobility with nursing or mobility specialist supervision. No anticipated PT needs after this hospital stay. Will sign off at this time.       If plan is discharge home, recommend the following: Assist for transportation   Can travel by private vehicle        Equipment Recommendations None recommended by PT  Recommendations for Other Services       Functional Status Assessment Patient has not had a recent decline in their functional status     Precautions / Restrictions Precautions Precautions:  (low fall risk) Restrictions Weight Bearing Restrictions: No      Mobility  Bed Mobility Overal bed mobility: Independent                  Transfers Overall transfer level: Independent                      Ambulation/Gait Ambulation/Gait assistance: Independent Gait Distance (Feet): 500 Feet Assistive device: None Gait Pattern/deviations: WFL(Within Functional Limits) Gait velocity: normal     General Gait Details: patient ambulated the unit x 2 laps without issue. gait is [facility] without device  Stairs            Wheelchair Mobility     Tilt Bed    Modified Rankin  (Stroke Patients Only)       Balance Overall balance assessment: No apparent balance deficits (not formally assessed)                                           Pertinent Vitals/Pain Pain Assessment Pain Assessment: Faces Faces Pain Scale: Hurts a little bit Pain Location: surgical site Pain Descriptors / Indicators: Discomfort Pain Intervention(s): Monitored during session    Home Living Family/patient expects to be discharged to:: Private residence Living Arrangements: Other relatives Available Help at Discharge: Family Type of Home: House Home Access: Level entry       Home Layout: One level Home Equipment: None      Prior Function Prior Level of Function : Independent/Modified Independent             Mobility Comments: does not drive. last seizure was last week. independent at baseline without device ADLs Comments: independent     Extremity/Trunk Assessment   Upper Extremity Assessment Upper Extremity Assessment: Overall WFL for tasks assessed    Lower Extremity Assessment Lower Extremity Assessment: Overall WFL for tasks assessed       Communication   Communication Communication: No apparent difficulties  Cognition Arousal: Alert Behavior During Therapy: WFL for tasks assessed/performed Overall Cognitive Status: Within Functional Limits for tasks assessed  General Comments General comments (skin integrity, edema, etc.): no significant change in vitals with activity    Exercises     Assessment/Plan    PT Assessment Patient does not need any further PT services  PT Problem List         PT Treatment Interventions      PT Goals (Current goals can be found in the Care Plan section)  Acute Rehab PT Goals PT Goal Formulation: All assessment and education complete, DC therapy Potential to Achieve Goals: Good    Frequency       Co-evaluation PT/OT/SLP  Co-Evaluation/Treatment: Yes Reason for Co-Treatment: Complexity of the patient's impairments (multi-system involvement) PT goals addressed during session: Mobility/safety with mobility         AM-PAC PT "6 Clicks" Mobility  Outcome Measure Help needed turning from your back to your side while in a flat bed without using bedrails?: None Help needed moving from lying on your back to sitting on the side of a flat bed without using bedrails?: None Help needed moving to and from a bed to a chair (including a wheelchair)?: None Help needed standing up from a chair using your arms (e.g., wheelchair or bedside chair)?: None Help needed to walk in hospital room?: None Help needed climbing 3-5 steps with a railing? : None 6 Click Score: 24    End of Session   Activity Tolerance: Patient tolerated treatment well Patient left: in bed;with call bell/phone within reach Nurse Communication: Mobility status PT Visit Diagnosis: Other abnormalities of gait and mobility (R26.89)    Time: 1610-9604 PT Time Calculation (min) (ACUTE ONLY): 26 min   Charges:   PT Evaluation $PT Eval Moderate Complexity: 1 Mod   PT General Charges $$ ACUTE PT VISIT: 1 Visit         Donna Bernard, PT, MPT   Ina Homes 01/26/2023, 2:33 PM

## 2023-01-26 NOTE — Progress Notes (Signed)
OT Cancellation Note  Patient Details Name: Diane Kelly MRN: [id number] DOB: Sep 21, 2000   Cancelled Treatment:    Reason Eval/Treat Not Completed: Other (comment) (orders recieved, chart reviewed, before OT entered room, HR noted to be in 170s. Nurse in room to assess. OT will hold at this time, reattempt as able.Oleta Mouse, OTD OTR/L  01/26/23, 10:12 AM

## 2023-01-26 NOTE — Plan of Care (Signed)
Patient alert and oriented. Neuro checks every 4 hours. Incision is dry and intact with no drainage. Has had some complaints of headache-medication given per MAR. Patient is on room air. Patient did have irregular heart rhythms mid-morn, EKG ordered and placed in chart (MD aware). Patient tolerated clear liquid diet and advanced to regular. Patient urinated without any complication. Worked with PT and OT this afternoon. Awaiting to be transferred to floor. Continue to monitor.

## 2023-01-27 ENCOUNTER — Emergency Department: Payer: Medicaid Other

## 2023-01-27 ENCOUNTER — Encounter (HOSPITAL_COMMUNITY): Payer: Self-pay

## 2023-01-27 ENCOUNTER — Emergency Department
Admission: EM | Admit: 2023-01-27 | Discharge: 2023-01-28 | Disposition: A | Discharge status: Other Acute Inpatient Hospital | Payer: Medicaid Other | Attending: Emergency Medicine | Admitting: Emergency Medicine

## 2023-01-27 ENCOUNTER — Other Ambulatory Visit: Payer: Self-pay

## 2023-01-27 DIAGNOSIS — R569 Unspecified convulsions: Secondary | ICD-10-CM | POA: Diagnosis present

## 2023-01-27 DIAGNOSIS — G40901 Epilepsy, unspecified, not intractable, with status epilepticus: Secondary | ICD-10-CM | POA: Insufficient documentation

## 2023-01-27 DIAGNOSIS — R4182 Altered mental status, unspecified: Secondary | ICD-10-CM | POA: Insufficient documentation

## 2023-01-27 LAB — LACTIC ACID, PLASMA
Lactic Acid, Venous: >9 mmol/L (ref 0.5–1.9)
Lactic Acid, Venous: 1.6 mmol/L (ref 0.5–1.9)

## 2023-01-27 LAB — URINALYSIS, W/ REFLEX TO CULTURE (INFECTION SUSPECTED)
Bilirubin Urine: NEGATIVE
Glucose, UA: >=500 mg/dL — AB
Ketones, ur: NEGATIVE mg/dL
Leukocytes,Ua: NEGATIVE
Nitrite: NEGATIVE
Protein, ur: 30 mg/dL — AB
RBC / HPF: >50 RBC/hpf (ref 0–5)
Specific Gravity, Urine: 1.014 (ref 1.005–1.030)
pH: 5 (ref 5.0–8.0)

## 2023-01-27 LAB — URINE DRUG SCREEN, QUALITATIVE (ARMC ONLY)
Amphetamines, Ur Screen: NOT DETECTED
Barbiturates, Ur Screen: POSITIVE — AB
Benzodiazepine, Ur Scrn: POSITIVE — AB
Cannabinoid 50 Ng, Ur ~~LOC~~: POSITIVE — AB
Cocaine Metabolite,Ur ~~LOC~~: NOT DETECTED
MDMA (Ecstasy)Ur Screen: NOT DETECTED
Methadone Scn, Ur: NOT DETECTED
Opiate, Ur Screen: NOT DETECTED
Phencyclidine (PCP) Ur S: NOT DETECTED
Tricyclic, Ur Screen: NOT DETECTED

## 2023-01-27 LAB — CBC WITH DIFFERENTIAL/PLATELET
Abs Immature Granulocytes: 0.12 10*3/uL — ABNORMAL HIGH (ref 0.00–0.07)
Basophils Absolute: 0 10*3/uL (ref 0.0–0.1)
Basophils Relative: 0 %
Eosinophils Absolute: 0 10*3/uL (ref 0.0–0.5)
Eosinophils Relative: 0 %
HCT: 37.3 % (ref 36.0–46.0)
Hemoglobin: 12.6 g/dL (ref 12.0–15.0)
Immature Granulocytes: 1 %
Lymphocytes Relative: 7 %
Lymphs Abs: 1.1 10*3/uL (ref 0.7–4.0)
MCH: 29.9 pg (ref 26.0–34.0)
MCHC: 33.8 g/dL (ref 30.0–36.0)
MCV: 88.4 fL (ref 80.0–100.0)
Monocytes Absolute: 1.8 10*3/uL — ABNORMAL HIGH (ref 0.1–1.0)
Monocytes Relative: 11 %
Neutro Abs: 13.6 10*3/uL — ABNORMAL HIGH (ref 1.7–7.7)
Neutrophils Relative %: 81 %
Platelets: 242 10*3/uL (ref 150–400)
RBC: 4.22 MIL/uL (ref 3.87–5.11)
RDW: 12.3 % (ref 11.5–15.5)
WBC: 16.7 10*3/uL — ABNORMAL HIGH (ref 4.0–10.5)
nRBC: 0 % (ref 0.0–0.2)

## 2023-01-27 LAB — SURGICAL PATHOLOGY

## 2023-01-27 LAB — BLOOD GAS, ARTERIAL
Acid-base deficit: 0.9 mmol/L (ref 0.0–2.0)
Bicarbonate: 20.1 mmol/L (ref 20.0–28.0)
FIO2: 40 %
MECHVT: 375 mL
Mechanical Rate: 18
O2 Saturation: 100 %
PEEP: 5 cmH2O
Patient temperature: 37
pCO2 arterial: 24 mm[Hg] — ABNORMAL LOW (ref 32–48)
pH, Arterial: 7.53 — ABNORMAL HIGH (ref 7.35–7.45)
pO2, Arterial: 189 mm[Hg] — ABNORMAL HIGH (ref 83–108)

## 2023-01-27 LAB — COMPREHENSIVE METABOLIC PANEL
ALT: 147 U/L — ABNORMAL HIGH (ref 0–44)
AST: 66 U/L — ABNORMAL HIGH (ref 15–41)
Albumin: 4.1 g/dL (ref 3.5–5.0)
Alkaline Phosphatase: 61 U/L (ref 38–126)
Anion gap: 21 — ABNORMAL HIGH (ref 5–15)
BUN: 14 mg/dL (ref 6–20)
CO2: 10 mmol/L — ABNORMAL LOW (ref 22–32)
Calcium: 8.7 mg/dL — ABNORMAL LOW (ref 8.9–10.3)
Chloride: 103 mmol/L (ref 98–111)
Creatinine, Ser: 0.95 mg/dL (ref 0.44–1.00)
GFR, Estimated: >60 mL/min (ref >60)
Glucose, Bld: 310 mg/dL — ABNORMAL HIGH (ref 70–99)
Potassium: 3.4 mmol/L — ABNORMAL LOW (ref 3.5–5.1)
Sodium: 134 mmol/L — ABNORMAL LOW (ref 135–145)
Total Bilirubin: 0.5 mg/dL (ref <1.2)
Total Protein: 7.3 g/dL (ref 6.5–8.1)

## 2023-01-27 LAB — MAGNESIUM: Magnesium: 2.6 mg/dL — ABNORMAL HIGH (ref 1.7–2.4)

## 2023-01-27 LAB — POC URINE PREG, ED: Preg Test, Ur: NEGATIVE

## 2023-01-27 MED ORDER — LEVETIRACETAM IN NACL 1000 MG/100ML IV SOLN
1000.0000 mg | Freq: Once | INTRAVENOUS | Status: AC
  Administered 2023-01-27: 1000 mg via INTRAVENOUS

## 2023-01-27 MED ORDER — ONDANSETRON 4 MG PO TBDP: 4.0000 mg | ORAL_TABLET | Freq: Three times a day (TID) | ORAL | 0 refills | Status: DC | PRN

## 2023-01-27 MED ORDER — LEVETIRACETAM IN NACL 1000 MG/100ML IV SOLN
1000.0000 mg | Freq: Once | INTRAVENOUS | Status: AC
  Filled 2023-01-27: qty 100

## 2023-01-27 MED ORDER — SENNA 8.6 MG PO TABS: 1.0000 | ORAL_TABLET | Freq: Two times a day (BID) | ORAL | 0 refills | Status: DC | PRN

## 2023-01-27 MED ORDER — DIPHENHYDRAMINE HCL 50 MG/ML IJ SOLN: 50.0000 mg | Freq: Once | INTRAMUSCULAR | Status: DC

## 2023-01-27 MED ORDER — PROPOFOL 1000 MG/100ML IV EMUL
0.0000 ug/kg/min | INTRAVENOUS | Status: DC
  Filled 2023-01-27 (×2): qty 100

## 2023-01-27 MED ORDER — LORAZEPAM 2 MG/ML IJ SOLN: 2.0000 mg | Freq: Once | INTRAMUSCULAR | Status: AC

## 2023-01-27 MED ORDER — PROPOFOL 10 MG/ML IV BOLUS: 100.0000 mg | Freq: Once | INTRAVENOUS | Status: DC

## 2023-01-27 MED ORDER — PROPOFOL 10 MG/ML IV BOLUS
INTRAVENOUS | Status: AC
  Administered 2023-01-27: 20 ug/kg/min via INTRAVENOUS
  Filled 2023-01-27: qty 20

## 2023-01-27 MED ORDER — ROCURONIUM BROMIDE 10 MG/ML (PF) SYRINGE
PREFILLED_SYRINGE | INTRAVENOUS | Status: AC
  Filled 2023-01-27: qty 10

## 2023-01-27 MED ORDER — LEVETIRACETAM IN NACL 1000 MG/100ML IV SOLN
INTRAVENOUS | Status: AC
  Administered 2023-01-27: 1000 mg via INTRAVENOUS
  Filled 2023-01-27: qty 100

## 2023-01-27 MED ORDER — ROCURONIUM BROMIDE 10 MG/ML (PF) SYRINGE: 50.0000 mg | PREFILLED_SYRINGE | Freq: Once | INTRAVENOUS | Status: DC

## 2023-01-27 MED ORDER — LEVETIRACETAM 500 MG/5ML IV SOLN: 2000.0000 mg | Freq: Once | INTRAVENOUS | Status: DC

## 2023-01-27 MED ORDER — NOREPINEPHRINE 4 MG/250ML-% IV SOLN: 0.0000 ug/min | INTRAVENOUS | Status: DC

## 2023-01-27 MED ORDER — HALOPERIDOL LACTATE 5 MG/ML IJ SOLN
INTRAMUSCULAR | Status: AC
  Filled 2023-01-27: qty 1

## 2023-01-27 MED ORDER — LORAZEPAM 2 MG/ML IJ SOLN
2.0000 mg | Freq: Once | INTRAMUSCULAR | Status: AC
  Administered 2023-01-27: 2 mg via INTRAVENOUS
  Filled 2023-01-27: qty 1

## 2023-01-27 MED ORDER — HALOPERIDOL LACTATE 5 MG/ML IJ SOLN: 5.0000 mg | Freq: Once | INTRAMUSCULAR | Status: DC

## 2023-01-27 MED ORDER — DIPHENHYDRAMINE HCL 50 MG/ML IJ SOLN
INTRAMUSCULAR | Status: AC
  Filled 2023-01-27: qty 1

## 2023-01-27 MED ORDER — OXYCODONE HCL 5 MG PO TABS: 2.5000 mg | ORAL_TABLET | ORAL | 0 refills | Status: DC | PRN

## 2023-01-27 MED ORDER — MIDAZOLAM BOLUS VIA INFUSION: 0.0000 mg | INTRAVENOUS | Status: DC | PRN

## 2023-01-27 MED ORDER — NOREPINEPHRINE 4 MG/250ML-% IV SOLN
INTRAVENOUS | Status: AC
  Filled 2023-01-27: qty 250

## 2023-01-27 MED ORDER — ROCURONIUM BROMIDE 10 MG/ML (PF) SYRINGE
PREFILLED_SYRINGE | INTRAVENOUS | Status: DC | PRN
  Administered 2023-01-27: 50 mg via INTRAVENOUS

## 2023-01-27 MED ORDER — MIDAZOLAM-SODIUM CHLORIDE 100-0.9 MG/100ML-% IV SOLN
0.0000 mg/h | INTRAVENOUS | Status: DC
Start: 2023-01-27 — End: 2023-01-28
  Administered 2023-01-27: 2 mg/h via INTRAVENOUS
  Filled 2023-01-27: qty 100

## 2023-01-27 MED ORDER — LORAZEPAM 2 MG/ML IJ SOLN
INTRAMUSCULAR | Status: AC
  Administered 2023-01-27: 2 mg via INTRAMUSCULAR
  Filled 2023-01-27: qty 1

## 2023-01-27 MED ORDER — PROPOFOL 10 MG/ML IV BOLUS
INTRAVENOUS | Status: DC | PRN
  Administered 2023-01-27: 100 mg via INTRAVENOUS

## 2023-01-27 NOTE — ED Notes (Signed)
Attempted to take patient for CT scan of the head. Staff was unable to get her to lay on her back for the CT scan. Patient returned to her ED room. Provider notified. Will make a second attempt when patient is more calm.

## 2023-01-27 NOTE — ED Provider Notes (Signed)
[facility] Provider Note   Event Date/Time   First MD Initiated Contact with Patient 01/27/23 1820     (approximate) History  Seizures  HPI Diane Kelly is a 22 y.o. female with a past medical history of epilepsy and 2 days status post right temporal craniotomy for tumor resection who presents via EMS after 2 witnessed seizure-like events.  Patient's significant other witnessed the first after an episode of sexual intercourse.  Patient then had another episode lasting approximately 5 minutes prior to EMS arrival.  EMS gave 2 mg of Versed en route with no further seizure-like activity.  They noted patient to be minimally responsive and postictal throughout transport.  EMS report no external visible signs of trauma ROS: Unable to assess   Physical Exam  Triage Vital Signs: ED Triage Vitals  Encounter Vitals Group     BP 01/27/23 1822 105/71     Systolic BP Percentile --      Diastolic BP Percentile --      Pulse Rate 01/27/23 1822 77     Resp --      Temp 01/27/23 1822 98.4 F (36.9 C)     Temp Source 01/27/23 1822 Oral     SpO2 01/27/23 1818 93 %     Weight 01/27/23 1824 105 lb 13.1 oz (48 kg)     Height 01/27/23 1824 5\' 2"  (1.575 m)     Head Circumference --      Peak Flow --      Pain Score --      Pain Loc --      Pain Education --      Exclude from Growth Chart --    Most recent vital signs: Vitals:   01/27/23 2215 01/27/23 2230  BP: (!) 119/96 (!) 114/98  Pulse: (!) 102 (!) 101  Resp: 20 (!) 23  Temp: 98.6 F (37 C) 98.8 F (37.1 C)  SpO2: 99% 100%   General: Asleep on stretcher CV:  Good peripheral perfusion.  Resp:  Normal effort.  Abd:  No distention.  Other:  Young adult well-developed, well-nourished Caucasian female GCS 9 ED Results / Procedures / Treatments  Labs (all labs ordered are listed, but only abnormal results are displayed) Labs Reviewed  COMPREHENSIVE METABOLIC PANEL - Abnormal; Notable for the following  components:      Result Value   Sodium 134 (*)    Potassium 3.4 (*)    CO2 10 (*)    Glucose, Bld 310 (*)    Calcium 8.7 (*)    AST 66 (*)    ALT 147 (*)    Anion gap 21 (*)    All other components within normal limits  LACTIC ACID, PLASMA - Abnormal; Notable for the following components:   Lactic Acid, Venous >9.0 (*)    All other components within normal limits  URINE DRUG SCREEN, QUALITATIVE ([facility] ONLY) - Abnormal; Notable for the following components:   Cannabinoid 50 Ng, Ur [place] POSITIVE (*)    Barbiturates, Ur Screen POSITIVE (*)    Benzodiazepine, Ur Scrn POSITIVE (*)    All other components within normal limits  URINALYSIS, W/ REFLEX TO CULTURE (INFECTION SUSPECTED) - Abnormal; Notable for the following components:   Color, Urine YELLOW (*)    APPearance HAZY (*)    Glucose, UA >=500 (*)    Hgb urine dipstick LARGE (*)    Protein, ur 30 (*)    Bacteria, UA RARE (*)  All other components within normal limits  BLOOD GAS, ARTERIAL - Abnormal; Notable for the following components:   pH, Arterial 7.53 (*)    pCO2 arterial 24 (*)    pO2, Arterial 189 (*)    All other components within normal limits  CBC WITH DIFFERENTIAL/PLATELET - Abnormal; Notable for the following components:   WBC 16.7 (*)    Neutro Abs 13.6 (*)    Monocytes Absolute 1.8 (*)    Abs Immature Granulocytes 0.12 (*)    All other components within normal limits  LACTIC ACID, PLASMA  CBC WITH DIFFERENTIAL/PLATELET  TRIGLYCERIDES  POC URINE PREG, ED   EKG ED ECG REPORT I, Merwyn Katos, the attending physician, personally viewed and interpreted this ECG. Date: 01/27/2023 EKG Time: 2109 Rate: 151 Rhythm: Tachycardic sinus rhythm QRS Axis: normal Intervals: normal ST/T Wave abnormalities: normal Narrative Interpretation: Tachycardic sinus rhythm.  No evidence of acute ischemia RADIOLOGY ED MD interpretation: Head CT independently interpreted shows expected postoperative changes from recent right  temporal craniotomy and surgical defect involving the right temporal lobe.  There is no complicating features such as hematoma or brain edema.  X-ray of the chest and abdomen single view portable independently interpreted and shows ET tube in adequate positioning as well as endogastric tube in the body region of the stomach. -Agree with radiology assessment Official radiology report(s): CT Head Wo Contrast  Result Date: 01/27/2023 CLINICAL DATA:  History of recent surgical resection of a right temporal lobe lesion. Seizure activity at home. EXAM: CT HEAD WITHOUT CONTRAST TECHNIQUE: Contiguous axial images were obtained from the base of the skull through the vertex without intravenous contrast. RADIATION DOSE REDUCTION: This exam was performed according to the departmental dose-optimization program which includes automated exposure control, adjustment of the mA and/or kV according to patient size and/or use of iterative reconstruction technique. COMPARISON:  Multiple prior imaging studies. Most recent MRI is 12/07/2022 FINDINGS: Brain: Expected postoperative changes from a recent right temporal craniotomy and surgical defect involving the right temporal lobe. Subdural air is not an unexpected finding. No evidence of hematoma, mass effect or shift of the midline structures. The ventricles are in the midline appears stable in size and configuration to the preoperative studies. The brainstem and cerebellum are unremarkable. Vascular: No hyperdense vessel or unexpected calcification. Skull: Surgical changes from a right temporal craniotomy. Sinuses/Orbits: The paranasal sinuses and mastoid air cells are clear. The globes are intact. Other: No scalp lesions or scalp hematoma. IMPRESSION: 1. Expected postoperative changes from a recent right temporal craniotomy and surgical defect involving the right temporal lobe. 2. No complicating features such as hematoma or brain edema. Electronically Signed   By: Rudie Meyer  M.D.   On: 01/27/2023 22:21   DG Abd Portable 1 View  Result Date: 01/27/2023 CLINICAL DATA:  NG tube placement. EXAM: PORTABLE ABDOMEN - 1 VIEW COMPARISON:  None Available. FINDINGS: The NG tube tip is in the body region of the stomach. The visualized lungs are clear. The bowel gas pattern is unremarkable. IMPRESSION: NG tube tip is in the body region of the stomach. Electronically Signed   By: Rudie Meyer M.D.   On: 01/27/2023 22:14   DG Chest Portable 1 View  Result Date: 01/27/2023 CLINICAL DATA:  Intubation. EXAM: PORTABLE CHEST 1 VIEW COMPARISON:  None Available. FINDINGS: The endotracheal tube tip is 18 mm above the carina. The NG tube is coursing down the esophagus and into the stomach. The heart is normal in size. The mediastinal  hilar contours are normal. The lungs are clear. The bony structures are intact. IMPRESSION: 1. Endotracheal tube tip 18 mm above the carina. 2. No acute cardiopulmonary findings. Electronically Signed   By: Rudie Meyer M.D.   On: 01/27/2023 22:13   PROCEDURES: Critical Care performed: Yes, see critical care procedure note(s) Procedure Name: Intubation Date/Time: 01/27/2023 10:41 PM  Performed by: Merwyn Katos, MDPre-anesthesia Checklist: Patient identified, Patient being monitored, Emergency Drugs available, Timeout performed and Suction available Oxygen Delivery Method: Non-rebreather mask Preoxygenation: Pre-oxygenation with 100% oxygen Induction Type: Rapid sequence Ventilation: Mask ventilation without difficulty Laryngoscope Size: Glidescope Grade View: Grade I Tube size: 7.5 mm Number of attempts: 1 Airway Equipment and Method: Video-laryngoscopy Placement Confirmation: ETT inserted through vocal cords under direct vision, CO2 detector and Breath sounds checked- equal and bilateral Secured at: 22 cm Tube secured with: ETT holder Dental Injury: Teeth and Oropharynx as per pre-operative assessment     CRITICAL CARE Performed by: Merwyn Katos  Total critical care time: 57 minutes  Critical care time was exclusive of separately billable procedures and treating other patients.  Critical care was necessary to treat or prevent imminent or life-threatening deterioration.  Critical care was time spent personally by me on the following activities: development of treatment plan with patient and/or surrogate as well as nursing, discussions with consultants, evaluation of patient's response to treatment, examination of patient, obtaining history from patient or surrogate, ordering and performing treatments and interventions, ordering and review of laboratory studies, ordering and review of radiographic studies, pulse oximetry and re-evaluation of patient's condition.  MEDICATIONS ORDERED IN ED: Medications  propofol (DIPRIVAN) 10 mg/mL bolus/IV push 100 mg (100 mg Intravenous See Procedure Record 01/27/23 2102)  rocuronium [facility]) injection 50 mg ( Intravenous Canceled Entry 01/27/23 2057)  propofol (DIPRIVAN) 1000 MG/100ML infusion (60 mcg/kg/min  48 kg Intravenous Rate/Dose Change 01/27/23 2225)  midazolam (VERSED) 100 mg/100 mL (1 mg/mL) premix infusion (4 mg/hr Intravenous Rate/Dose Change 01/27/23 2225)  midazolam (VERSED) bolus via infusion 0-5 mg (has no administration in time range)  norepinephrine (LEVOPHED) 4mg  in (0.016 mg/mL) premix infusion (has no administration in time range)  propofol (DIPRIVAN) 10 mg/mL bolus/IV push (100 mg Intravenous Given 01/27/23 2056)  rocuronium (ZEMURON) injection (50 mg Intravenous Given 01/27/23 2057)  levETIRAcetam (KEPPRA) IVPB 1000 mg/100 mL premix (1,000 mg Intravenous New Bag/Given 01/27/23 2221)    Followed by  levETIRAcetam (KEPPRA) IVPB 1000 mg/100 mL premix (1,000 mg Intravenous New Bag/Given 01/27/23 2238)  LORazepam (ATIVAN) injection 2 mg (2 mg Intramuscular Given 01/27/23 1935)  LORazepam (ATIVAN) injection 2 mg (2 mg Intravenous Given 01/27/23 2025)   IMPRESSION / MDM /  ASSESSMENT AND PLAN / ED COURSE  I reviewed the triage vital signs and the nursing notes.                             The patient is on the cardiac monitor to evaluate for evidence of arrhythmia and/or significant heart rate changes. Patient's presentation is most consistent with acute presentation with potential threat to life or bodily function. Patient a 22 year old female with the above-stated past medical history presents via EMS after jerking/shaking movements were witnessed by significant other.  Differential diagnosis includes nonadherence to antiepileptic drugs, lowered seizure threshold from infection, toxic ingestion No known head trauma, however will CT as patient is altered and unable to give full history as well as family/caregivers unable to provide reliable history.  Possibility of stroke though less likely given overarching symptoms of seizure and no overt focal deficits.  Patient has had recent craniotomy for tumor resection however Dr. Marcell Barlow has reviewed patient's head CT and does not feel that there is any need for surgical intervention at this time. Workup: CBC, CMP, UA, chest x-ray, ECG, CT brain  Field interventions: 2 mg Versed ED interventions: Intubation, 4 mg Ativan, propofol, Versed, Keppra Consult: Neurology regarding EEG.  I spoke to [place] in critical care and agree with plan for transfer to [facility] for continuous EEG Disposition: Transfer to [facility]   FINAL CLINICAL IMPRESSION(S) / ED DIAGNOSES   Final diagnoses:  Status epilepticus (HCC)  Altered mental status, unspecified altered mental status type   Rx / DC Orders   ED Discharge Orders     None      Note:  This document was prepared using Dragon voice recognition software and may include unintentional dictation errors.   Merwyn Katos, MD 01/27/23 [phone]

## 2023-01-27 NOTE — ED Notes (Signed)
Urine specimen, lt green, lavendar, lactic, blue top collected and in lab

## 2023-01-27 NOTE — ED Notes (Signed)
Verbal consent to transfer pt to [facility] was given over the phone by Candace Cruise, the patient's POA. This consent was witnessed by this RN and Herbert Seta, Charity fundraiser.

## 2023-01-27 NOTE — Progress Notes (Signed)
   Neurosurgery Progress Note  History: Diane Kelly is s/p right craniotomy for resection of temporal lesion  POD2: Pt complaining of some right facial and jaw pain this morning and discomfort around her incision. POD1: Pt experienced some nausea and vomiting overnight and expected headache.  Physical Exam: Vitals:   01/27/23 0700 01/27/23 0738  BP: (!) 107/58   Pulse: 71   Resp: 19   Temp:  98 F (36.7 C)  SpO2: 98%     Alert and oriented CNI  Strength:5/5 throughout  Incision is clean dry and intact with postoperative dressing in place.  Data:  Other tests/results:  Pathology results pending  Assessment/Plan:  Diane Kelly is a 22 year old with a longstanding history of seizure disorder status post right craniotomy for resection of right temporal lesion.  - mobilize - pain control; added Fioricet for headaches -orders to transfer to floor. -advance to regular diet - DVT prophylaxis -Continue to monitor incision closely. Will likely remove dressing this afternoon. - PTOT; dispo planning underway  Manning Charity PA-C Department of Neurosurgery

## 2023-01-27 NOTE — ED Notes (Signed)
Patient woke up at approx 1935 and ripped out her IV she was very agitated and was not able to speak clear sentences. Pt was speaking in word salad and was not making sense.  Patient was very combative and obviously confused. Per dr. Vicente Males, pt given 2 mg Ativan IM.

## 2023-01-27 NOTE — Progress Notes (Signed)
Occupational Therapy Treatment Patient Details Name: Diane Kelly MRN: [id number] DOB: [birth year] Today's Date: 01/27/2023   History of present illness Patient is a 22 year old female with seizure disorder s/p right craniotomy for resection of right temporal lesion.   OT comments  Chart reviewed to date, pt greeted in bed with fiance present. Pt performs ADL at MOD I-indep level, appropriate functional cognition noted throughout with pt recalling 3/3 words provided yesterday, following multi step directions appropriately. No further OT needs identified. OT will sign off. Please re consult if there is a change in functional status.       If plan is discharge home, recommend the following:  Assist for transportation;Assistance with cooking/housework   Equipment Recommendations  None recommended by OT    Recommendations for Other Services      Precautions / Restrictions Precautions Precautions: Fall Restrictions Weight Bearing Restrictions: No       Mobility Bed Mobility Overal bed mobility: Independent                  Transfers Overall transfer level: Modified independent                       Balance Overall balance assessment: Needs assistance Sitting-balance support: Feet supported Sitting balance-Leahy Scale: Normal     Standing balance support: No upper extremity supported Standing balance-Leahy Scale: Good                             ADL either performed or assessed with clinical judgement   ADL Overall ADL's : Needs assistance/impaired Eating/Feeding: Set up;Sitting   Grooming: Oral care;Wash/dry face;Modified independent;Wash/dry hands;Standing           Upper Body Dressing : Modified independent;Sitting   Lower Body Dressing: Modified independent;Sit to/from stand Lower Body Dressing Details (indicate cue type and reason): socks, underwear donn/doff Toilet Transfer: Modified Independent;Ambulation Toilet Transfer  Details (indicate cue type and reason): no AD Toileting- Clothing Manipulation and Hygiene: Modified independent       Functional mobility during ADLs: Modified independent;Independent (200')      Extremity/Trunk Assessment              Vision       Perception     Praxis      Cognition Arousal: Alert Behavior During Therapy: WFL for tasks assessed/performed Overall Cognitive Status: Within Functional Limits for tasks assessed                                 General Comments: continued approrpiate WFL functional cognition, Pt remembers 3/3 words provided to test immediate/STM yesterday; good multi step direction following        Exercises Other Exercises Other Exercises: edu re: continued cognition training at home as needed (no functional deficits noted on this date) with use of apps on phone (pt reports she does this already)    Shoulder Instructions       General Comments vss throughout    Pertinent Vitals/ Pain       Pain Assessment Pain Assessment: 0-10 Pain Score: 2  Pain Location: surgical site Pain Descriptors / Indicators: Discomfort Pain Intervention(s): Monitored during session, Premedicated before session  Home Living  Prior Functioning/Environment              Frequency           Progress Toward Goals  OT Goals(current goals can now be found in the care plan section)        Plan      Co-evaluation                 AM-PAC OT "6 Clicks" Daily Activity     Outcome Measure   Help from another person eating meals?: None Help from another person taking care of personal grooming?: None Help from another person toileting, which includes using toliet, bedpan, or urinal?: None Help from another person bathing (including washing, rinsing, drying)?: None Help from another person to put on and taking off regular upper body clothing?: None Help from another  person to put on and taking off regular lower body clothing?: None 6 Click Score: 24    End of Session        Activity Tolerance Patient tolerated treatment well   Patient Left in bed;with call bell/phone within reach;with family/visitor present   Nurse Communication Mobility status        Time: 4098-1191 OT Time Calculation (min): 18 min  Charges: OT General Charges $OT Visit: 1 Visit OT Treatments $Self Care/Home Management : 8-22 mins  Oleta Mouse, OTD OTR/L  01/27/23, 10:38 AM

## 2023-01-27 NOTE — Progress Notes (Signed)
Discharge instructions given to patient with verbalized understanding.  VSS. Patient taken to visitors entrance via wheelchair to be taken home by boyfriend  via personal vehicle.

## 2023-01-27 NOTE — Discharge Instructions (Signed)
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 Increase protein intake to promote wound healing.  Recommended activity: no lifting, driving, or strenuous exercise for 4 weeks . You should walk multiple times per day  Special Instructions  No straining, no heavy lifting > 10lbs x 4 weeks.  Keep incision area clean and dry. May shower tomorrow. Do not soak or scrub over incision. Pat dry after shower. No pools or hot tubs for 6 weeks.   You have no sutures to remove, as they will dissolve on their own.  Please take pain medications as directed. Take a stool softener if on pain medications   Please Report any of the following: Nausea or Vomiting, Temperature is greater than 101.25F (38.1C) degrees, Dizziness, Abdominal Pain, Difficulty Breathing or Shortness of Breath, Inability to Eat, drink Fluids, or Take medications, Bleeding, swelling, or drainage from surgical incision sites, New numbness or weakness, and Bowel or bladder dysfunction to the neurosurgeon on call. How to contact us:  If you have any questions/concerns before or after surgery, you can reach [place] at [phone], or you can send a mychart message. We can be reached by phone or mychart 8am-4pm, Monday-Friday.  *Please note: Calls after 4pm are forwarded to a third party answering service. Mychart messages are not routinely monitored during evenings, weekends, and holidays. Please call our office to contact the answering service for urgent concerns during non-business hours.   Additional Follow up appointments Please follow up with Manning Charity PA-C as scheduled in 2-3 weeks   Please see below for scheduled appointments:  Future Appointments  Date Time Provider Department Center  02/09/2023 10:00 AM Susanne Borders, PA [facility] None   03/09/2023 10:30 AM Venetia Night, MD [facility] None  04/15/2023 10:30 AM Susanne Borders, PA [facility] None

## 2023-01-27 NOTE — ED Notes (Signed)
Propofol initiated at rate of 59mcg/min per verbal order of MD Bradler at pt bedside.

## 2023-01-27 NOTE — ED Triage Notes (Signed)
Patient comes in from home via [facility] after havig two witnessed seizures at home. PT's fiance states that the seizures lasted anywhere from 2-5 minutes. Pt was just discharged from the hospital to have fluid removed from the right side of her head. Pt is only responsive  to painful stimuli at this time. MD Bradler at bedside.

## 2023-01-27 NOTE — ED Notes (Signed)
Attempted to call pt's POA (Ronnie) to get a verbal consent to transfer since pt is sedated and is unable to give consent. There was no answer, LM.

## 2023-01-27 NOTE — Discharge Summary (Signed)
Discharge Summary  Patient ID: Diane Kelly MRN: [id number] DOB/AGE: 05/28/2000 22 y.o.  Admit date: 01/25/2023 Discharge date: 01/27/2023  Admission Diagnoses: Brain tumor Discharge Diagnoses:  Principal Problem:   Brain tumor [facility])   Discharged Condition: good  Hospital Course:  Diane Kelly is a 22 y.o presenting with right temporal lesion status post right temporal craniotomy for tumor resection.  Her intraoperative course was uncomplicated.  She was admitted to the ICU for monitoring, pain control, and therapy evaluation.  Her postoperative course was complicated by an asymptomatic run of SVT.  A follow-up EKG was done which was normal.  Lab work was done to evaluate for an underlying electrolyte abnormality which was largely normal.  She did have elevated liver enzymes.  Neurology and pharmacy were contacted and recommended stopping her BuSpar.  There are plans to follow-up her liver enzymes at discharge.  She did experience some right-sided facial discomfort which was improved with medications and alternating ice and heat.  She was seen evaluated by therapy and deemed appropriate for discharge home on postop day 2.  She was given strict instructions to avoid medications containing Tylenol and alcohol due to her elevated liver enzymes and was discharged with a prescription of oxycodone to take as needed for pain.  Consults: None  Significant Diagnostic Studies: labs:     Latest Ref Rng & Units 01/26/2023   10:57 AM 01/13/2023    9:48 AM 09/25/2022    2:18 AM  CMP  Glucose 70 - 99 mg/dL 644  78  034   BUN 6 - 20 mg/dL 14  16  13    Creatinine 0.44 - 1.00 mg/dL 7.42  5.95  6.38   Sodium 135 - 145 mmol/L 137  136  138   Potassium 3.5 - 5.1 mmol/L 4.0  3.9  3.4   Chloride 98 - 111 mmol/L 106  106  104   CO2 22 - 32 mmol/L 22  25  23    Calcium 8.9 - 10.3 mg/dL 8.4  9.2  8.9   Total Protein 6.5 - 8.1 g/dL 6.9   7.3   Total Bilirubin <1.2 mg/dL 0.9   0.4   Alkaline Phos 38 -  126 U/L 57   77   AST 15 - 41 U/L 69   15   ALT 0 - 44 U/L 117   14      Treatments: steroids: Dexamethasone taper, therapies: PT and OT, and surgery: as above. Please see separately dictated operative report for further details  Discharge Exam: Blood pressure (!) 107/58, pulse 71, temperature 98 F (36.7 C), temperature source Axillary, resp. rate 19, height 5\' 2"  (1.575 m), weight 48 kg, last menstrual period 12/30/2022, SpO2 98%. Alert and oriented CNI   Strength:5/5 throughout  Incision is clean dry and intact and open to air with Monocryl suture in place.  Disposition: Discharge disposition: 01-Home or Self Care        Allergies as of 01/27/2023   No Known Allergies      Medication List     STOP taking these medications    busPIRone 15 MG tablet Commonly known as: BUSPAR       TAKE these medications    ibuprofen 200 MG tablet Commonly known as: ADVIL Take 800 mg by mouth every 6 (six) hours as needed for moderate pain (pain score 4-6).   lamoTRIgine 25 MG tablet Commonly known as: LAMICTAL Take 125 mg by mouth 2 (two) times daily.   meloxicam 15  MG tablet Commonly known as: MOBIC Take 15 mg by mouth daily as needed (post-seizure headaches).   ondansetron 4 MG disintegrating tablet Commonly known as: ZOFRAN-ODT Take 1 tablet (4 mg total) by mouth every 8 (eight) hours as needed for nausea or vomiting.   oxyCODONE 5 MG immediate release tablet Commonly known as: Oxy IR/ROXICODONE Take 0.5-1 tablets (2.5-5 mg total) by mouth every 4 (four) hours as needed for severe pain (pain score 7-10).   senna 8.6 MG Tabs tablet Commonly known as: SENOKOT Take 1 tablet (8.6 mg total) by mouth 2 (two) times daily as needed for mild constipation.        Follow-up Information     Susanne Borders, PA Follow up on 02/09/2023.   Specialty: Neurosurgery Contact information: [address] [place] [address] [phone]                  Signed: Susanne Borders 01/27/2023, 10:03 AM

## 2023-01-27 NOTE — Progress Notes (Signed)
Vent changes made. Verified with Dr Vicente Males.

## 2023-01-27 NOTE — Consult Note (Addendum)
Consult requested by:  Dr. Vicente Males  Consult requested for:  Post-operative seizure  Primary Physician:  Alm Bustard, NP  History of Present Illness: 01/27/2023 Diane Kelly underwent a right temporal craniotomy for lesion resection on Monday.  She was discharged today.  She was stable at the time.  She had 2 seizures prior to presentation.  She became extremely agitated and required sedation to control her agitation.  She was hitting different members of the care team.  She also had confusion and some difficulty with words at the time.   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   Vapes nicotine containing substance     Past Surgical History: Past Surgical History:  Procedure Laterality Date   APPLICATION OF CRANIAL NAVIGATION N/A 01/25/2023   Procedure: APPLICATION OF CRANIAL NAVIGATION;  Surgeon: Venetia Night, MD;  Location: [facility] ORS;  Service: Neurosurgery;  Laterality: N/A;   CRANIOTOMY Right 01/25/2023   Procedure: RIGHT CRANIOTOMY FOR TUMOR RESECTION;  Surgeon: Venetia Night, MD;  Location: [facility] ORS;  Service: Neurosurgery;  Laterality: Right;   NO PAST SURGERIES      Allergies: Allergies as of 01/27/2023   (No Known Allergies)    Medications: Current Meds  Medication Sig   ibuprofen (ADVIL) 200 MG tablet Take 800 mg by mouth every 6 (six) hours as needed for moderate pain (pain score 4-6).   lamoTRIgine (LAMICTAL) 150 MG tablet Take 150 mg by mouth 2 (two) times daily.   meloxicam (MOBIC) 15 MG tablet Take 15 mg by mouth daily as needed  (post-seizure headaches).   ondansetron (ZOFRAN-ODT) 4 MG disintegrating tablet Take 1 tablet (4 mg total) by mouth every 8 (eight) hours as needed for nausea or vomiting.   oxyCODONE (OXY IR/ROXICODONE) 5 MG immediate release tablet Take 0.5-1 tablets (2.5-5 mg total) by mouth every 4 (four) hours as needed for severe pain (pain score 7-10).   senna (SENOKOT) 8.6 MG TABS tablet Take 1 tablet (8.6 mg total) by mouth 2 (two) times daily as needed for mild constipation.   [DISCONTINUED] lamoTRIgine (LAMICTAL) 25 MG tablet Take 125 mg by mouth 2 (two) times daily.    Social History: Social History   Tobacco Use   Smoking status: Some Days    Current packs/day: 0.50    Types: Cigarettes   Smokeless tobacco: Current  Vaping Use   Vaping status: Every Day   Substances: Nicotine, Flavoring  Substance Use Topics   Alcohol use: Yes    Comment: occ   Drug use: Not Currently    Comment: cbd    Family Medical History: Family History  Problem Relation Age of Onset   Hypertension Mother    COPD Mother    COPD Maternal Grandmother    Anxiety disorder Maternal Grandmother    Sleep apnea Maternal Grandmother    COPD Maternal Grandfather    Anxiety disorder Paternal Grandmother    Asthma Paternal Grandmother    Depression Paternal Grandmother     Physical Examination:  Vitals:   01/27/23 2100 01/27/23 2105  BP: (!) 137/101 (!) 144/114  Pulse: (!) 144 (!) 149  Resp: (!) 24 13  Temp:  (!) 96.9 F (36.1 C)  SpO2: 96% 93%    General: Patient is intubated, sedated, and paralyzed.  She is not interactive due to significant sedation.  Pupils are 7 to 3 mm bilaterally.    No motor movement due to sedation and paralysis.  Surgical incision is clean dry and intact with expected appearance on postoperative day 2.  No evidence of infection.  Medical Decision Making  Imaging: CT Head 01/27/2023  Read pending.  Resection cavity shows no sign of hemorrhage.  No significant midline shift.   Expected post-operative pneumocephalus.  I have personally reviewed the images and agree with the above interpretation.  Assessment and Plan: Diane Kelly is a pleasant 22 y.o. female with postoperative seizure with known seizure disorder.  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  Her CT scan shows no significant findings that would warrant any urgent surgical intervention.  I recommend admission for critical care, consultation of neurology, and discussion with neurology regarding the utility of continuous EEG monitoring.  She will likely require intensive care support while she is intubated and then careful weaning of her sedation until she is extubated well.  She recently underwent titration of lamotrigine up to her current dosage.  She is also shown some evidence of elevated liver function test.  This may ultimately require some discussion of what outpatient antiseizure medication is most appropriate for her in the long run.  I do not think she will need any surgical interventions as a result of this.  This seems most consistent with postictal state, though there is some possibility that she is in status.  If she is ultimately transferred for neuro ICU monitoring, I am happy to be available to the intensive care unit team for any surgical issues.   I have communicated my recommendations to the requesting physician and coordinated care to facilitate these recommendations.     Azarya Oconnell K. Myer Haff MD, [facility] Neurosurgery

## 2023-01-27 NOTE — H&P (Incomplete)
NAME:  Diane Kelly, MRN:  [id number], DOB:  01-22-2001, LOS: 0 ADMISSION DATE:  01/27/2023, CONSULTATION DATE: 01/28/2023 REFERRING MD: Vicente Males, MD, CHIEF COMPLAINT:  seizures x2  History of Present Illness:  A 22 y.o. female with a past medical history of epilepsy on Lamotrigine and brain tumor (underwent 01/25/2023 right temporal craniotomy for resection) who presented via EMS after 2 witnessed seizure-like events. Patient's significant other witnessed the first after an episode of sexual intercourse.  Patient then had another episode lasting approximately 5 minutes prior to EMS arrival.  EMS gave 2 mg of Versed en route with no further seizure-like activity.  They noted patient to be minimally responsive and postictal throughout transport.  EMS report no external visible signs of trauma. In ED, she was very agitated, confused, and was not able to speak clear sentences. She received 4 mg IV/IM Ativan total and 2 gm of IV Keppra. She was intubated for airway protection. Seen by neuroSx and recommended continuous EEG.   Pertinent  Medical History  Slightly elevated LFT, depression,   Significant Hospital Events: Including procedures, antibiotic start and stop dates in addition to other pertinent events     Interim History / Subjective:  ***  Objective   Blood pressure (!) 112/94, pulse 100, temperature 99.1 F (37.3 C), resp. rate (!) 24, height 5\' 2"  (1.575 m), weight 48 kg, last menstrual period 12/30/2022, SpO2 100%.    Vent Mode: AC FiO2 (%):  [25 %-40 %] 25 % Set Rate:  [12 bmp-18 bmp] 12 bmp Vt Set:  [375 mL] 375 mL PEEP:  [5 cmH20] 5 cmH20  No intake or output data in the 24 hours ending 01/27/23 2354 Filed Weights   01/27/23 1824  Weight: 48 kg    Examination: General: *** HENT: *** Lungs: *** Cardiovascular: *** Abdomen: *** Extremities: *** Neuro: *** GU: ***  Resolved Hospital Problem list   ***  Assessment & Plan:  ***  Best Practice (right click and  "Reselect all SmartList Selections" daily)   Diet/type: {diet type:25684} DVT prophylaxis:    Pressure ulcer(s): {pressure ulcer(s):31683} GI prophylaxis: {AO:13086} Lines: {Central Venous Access:25771} Foley:  {Central Venous Access:25691} Code Status:  {Code Status:26939} Last date of multidisciplinary goals of care discussion [***]  Labs   CBC: Recent Labs  Lab 01/26/23 0549 01/27/23 2120  WBC 14.0* 16.7*  NEUTROABS 12.8* 13.6*  HGB 11.7* 12.6  HCT 34.7* 37.3  MCV 87.8 88.4  PLT 245 242    Basic Metabolic Panel: Recent Labs  Lab 01/26/23 1057 01/27/23 1815  NA 137 134*  K 4.0 3.4*  CL 106 103  CO2 22 10*  GLUCOSE 121* 310*  BUN 14 14  CREATININE 0.68 0.95  CALCIUM 8.4* 8.7*  MG 2.1  --    GFR: Estimated Creatinine Clearance: 70.4 mL/min (by C-G formula based on SCr of 0.95 mg/dL). Recent Labs  Lab 01/26/23 0549 01/27/23 1846 01/27/23 2120  WBC 14.0*  --  16.7*  LATICACIDVEN  --  >9.0* 1.6    Liver Function Tests: Recent Labs  Lab 01/26/23 1057 01/27/23 1815  AST 69* 66*  ALT 117* 147*  ALKPHOS 57 61  BILITOT 0.9 0.5  PROT 6.9 7.3  ALBUMIN 3.8 4.1   No results for input(s): "LIPASE", "AMYLASE" in the last 168 hours. No results for input(s): "AMMONIA" in the last 168 hours.  ABG    Component Value Date/Time   PHART 7.53 (H) 01/27/2023 2137   PCO2ART 24 (L) 01/27/2023 2137  PO2ART 189 (H) 01/27/2023 2137   HCO3 20.1 01/27/2023 2137   ACIDBASEDEF 0.9 01/27/2023 2137   O2SAT 100 01/27/2023 2137     Coagulation Profile: No results for input(s): "INR", "PROTIME" in the last 168 hours.  Cardiac Enzymes: No results for input(s): "CKTOTAL", "CKMB", "CKMBINDEX", "TROPONINI" in the last 168 hours.  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   Surgical History:   Past Surgical History:  Procedure Laterality Date  . APPLICATION OF CRANIAL NAVIGATION N/A 01/25/2023   Procedure: APPLICATION OF CRANIAL NAVIGATION;  Surgeon: Venetia Night, MD;  Location: [facility] ORS;  Service: Neurosurgery;  Laterality: N/A;  . CRANIOTOMY Right 01/25/2023   Procedure: RIGHT CRANIOTOMY FOR TUMOR RESECTION;  Surgeon: Venetia Night, MD;  Location: [facility] ORS;  Service: Neurosurgery;  Laterality: Right;  . NO PAST SURGERIES       Social History:   reports that she has been smoking cigarettes. She uses smokeless tobacco. She reports current alcohol use. She reports that she does not currently use drugs.   Family History:  Her family history includes Anxiety disorder in her maternal grandmother and paternal grandmother; Asthma in her paternal grandmother; COPD in her maternal grandfather, maternal grandmother, and mother; Depression in her paternal grandmother; Hypertension in her mother; Sleep apnea in her maternal grandmother.   Allergies No Known Allergies   Home Medications  Prior to Admission medications   Medication Sig Start Date End Date Taking? Authorizing Provider  ibuprofen (ADVIL) 200 MG tablet Take 800 mg by mouth every 6 (six) hours as needed for moderate pain (pain score 4-6).   Yes [provider]  lamoTRIgine (LAMICTAL) 150 MG tablet Take 150 mg by mouth 2 (two) times daily. 01/18/23  Yes [provider]  meloxicam (MOBIC) 15 MG tablet Take  15 mg by mouth daily as needed (post-seizure headaches). 11/17/22 11/17/23 Yes [provider]  ondansetron (ZOFRAN-ODT) 4 MG disintegrating tablet Take 1 tablet (4 mg total) by mouth every 8 (eight) hours as needed for nausea or vomiting. 01/27/23  Yes Susanne Borders, PA  oxyCODONE (OXY IR/ROXICODONE) 5 MG immediate release tablet Take 0.5-1 tablets (2.5-5 mg total) by mouth every 4 (four) hours as needed for severe pain (pain score 7-10). 01/27/23  Yes Susanne Borders, PA  senna (SENOKOT) 8.6 MG TABS tablet Take 1 tablet (8.6 mg total) by mouth 2 (two) times daily as needed for mild constipation. 01/27/23  Yes Susanne Borders, PA     Critical care time: ***

## 2023-01-28 ENCOUNTER — Inpatient Hospital Stay (HOSPITAL_COMMUNITY)
Admit: 2023-01-28 | Discharge: 2023-01-29 | DRG: 100 | Disposition: A | Discharge status: Home / Self Care | Payer: Medicaid Other | Source: Other Acute Inpatient Hospital | Attending: Internal Medicine | Admitting: Internal Medicine

## 2023-01-28 ENCOUNTER — Inpatient Hospital Stay (HOSPITAL_COMMUNITY): Payer: Medicaid Other

## 2023-01-28 DIAGNOSIS — D496 Neoplasm of unspecified behavior of brain: Secondary | ICD-10-CM | POA: Diagnosis present

## 2023-01-28 DIAGNOSIS — E876 Hypokalemia: Secondary | ICD-10-CM | POA: Diagnosis present

## 2023-01-28 DIAGNOSIS — E872 Acidosis, unspecified: Secondary | ICD-10-CM | POA: Diagnosis present

## 2023-01-28 DIAGNOSIS — R7989 Other specified abnormal findings of blood chemistry: Secondary | ICD-10-CM | POA: Diagnosis present

## 2023-01-28 DIAGNOSIS — N179 Acute kidney failure, unspecified: Secondary | ICD-10-CM | POA: Diagnosis present

## 2023-01-28 DIAGNOSIS — F17210 Nicotine dependence, cigarettes, uncomplicated: Secondary | ICD-10-CM | POA: Diagnosis present

## 2023-01-28 DIAGNOSIS — R000 Tachycardia, unspecified: Secondary | ICD-10-CM | POA: Diagnosis present

## 2023-01-28 DIAGNOSIS — R6511 Systemic inflammatory response syndrome (SIRS) of non-infectious origin with acute organ dysfunction: Secondary | ICD-10-CM | POA: Diagnosis present

## 2023-01-28 DIAGNOSIS — F419 Anxiety disorder, unspecified: Secondary | ICD-10-CM | POA: Diagnosis present

## 2023-01-28 DIAGNOSIS — R0689 Other abnormalities of breathing: Secondary | ICD-10-CM | POA: Diagnosis present

## 2023-01-28 DIAGNOSIS — F32A Depression, unspecified: Secondary | ICD-10-CM | POA: Diagnosis present

## 2023-01-28 DIAGNOSIS — D72829 Elevated white blood cell count, unspecified: Secondary | ICD-10-CM | POA: Diagnosis present

## 2023-01-28 DIAGNOSIS — R81 Glycosuria: Secondary | ICD-10-CM | POA: Diagnosis present

## 2023-01-28 DIAGNOSIS — G40909 Epilepsy, unspecified, not intractable, without status epilepticus: Principal | ICD-10-CM | POA: Diagnosis present

## 2023-01-28 DIAGNOSIS — Z79899 Other long term (current) drug therapy: Secondary | ICD-10-CM

## 2023-01-28 DIAGNOSIS — E871 Hypo-osmolality and hyponatremia: Secondary | ICD-10-CM | POA: Diagnosis present

## 2023-01-28 DIAGNOSIS — Z818 Family history of other mental and behavioral disorders: Secondary | ICD-10-CM

## 2023-01-28 DIAGNOSIS — R451 Restlessness and agitation: Secondary | ICD-10-CM | POA: Diagnosis present

## 2023-01-28 DIAGNOSIS — F3481 Disruptive mood dysregulation disorder: Secondary | ICD-10-CM | POA: Diagnosis present

## 2023-01-28 DIAGNOSIS — R809 Proteinuria, unspecified: Secondary | ICD-10-CM | POA: Diagnosis present

## 2023-01-28 DIAGNOSIS — Z9152 Personal history of nonsuicidal self-harm: Secondary | ICD-10-CM | POA: Diagnosis not present

## 2023-01-28 DIAGNOSIS — R569 Unspecified convulsions: Principal | ICD-10-CM

## 2023-01-28 DIAGNOSIS — R011 Cardiac murmur, unspecified: Secondary | ICD-10-CM | POA: Diagnosis present

## 2023-01-28 LAB — COMPREHENSIVE METABOLIC PANEL
ALT: 117 U/L — ABNORMAL HIGH (ref 0–44)
AST: 52 U/L — ABNORMAL HIGH (ref 15–41)
Albumin: 3.8 g/dL (ref 3.5–5.0)
Alkaline Phosphatase: 58 U/L (ref 38–126)
Anion gap: 13 (ref 5–15)
BUN: 18 mg/dL (ref 6–20)
CO2: 21 mmol/L — ABNORMAL LOW (ref 22–32)
Calcium: 8.5 mg/dL — ABNORMAL LOW (ref 8.9–10.3)
Chloride: 98 mmol/L (ref 98–111)
Creatinine, Ser: 1.16 mg/dL — ABNORMAL HIGH (ref 0.44–1.00)
GFR, Estimated: >60 mL/min (ref >60)
Glucose, Bld: 99 mg/dL (ref 70–99)
Potassium: 3.9 mmol/L (ref 3.5–5.1)
Sodium: 132 mmol/L — ABNORMAL LOW (ref 135–145)
Total Bilirubin: 0.8 mg/dL (ref <1.2)
Total Protein: 6.8 g/dL (ref 6.5–8.1)

## 2023-01-28 LAB — GLUCOSE, CAPILLARY
Glucose-Capillary: 100 mg/dL — ABNORMAL HIGH (ref 70–99)
Glucose-Capillary: 95 mg/dL (ref 70–99)
Glucose-Capillary: 89 mg/dL (ref 70–99)
Glucose-Capillary: 92 mg/dL (ref 70–99)
Glucose-Capillary: 98 mg/dL (ref 70–99)
Glucose-Capillary: 104 mg/dL — ABNORMAL HIGH (ref 70–99)

## 2023-01-28 LAB — CBC
HCT: 34.5 % — ABNORMAL LOW (ref 36.0–46.0)
Hemoglobin: 11.6 g/dL — ABNORMAL LOW (ref 12.0–15.0)
MCH: 30 pg (ref 26.0–34.0)
MCHC: 33.6 g/dL (ref 30.0–36.0)
MCV: 89.1 fL (ref 80.0–100.0)
Platelets: 239 10*3/uL (ref 150–400)
RBC: 3.87 MIL/uL (ref 3.87–5.11)
RDW: 12.7 % (ref 11.5–15.5)
WBC: 16.3 10*3/uL — ABNORMAL HIGH (ref 4.0–10.5)
nRBC: 0 % (ref 0.0–0.2)

## 2023-01-28 LAB — PHOSPHORUS: Phosphorus: 3.4 mg/dL (ref 2.5–4.6)

## 2023-01-28 LAB — MAGNESIUM: Magnesium: 2.2 mg/dL (ref 1.7–2.4)

## 2023-01-28 LAB — HIV ANTIBODY (ROUTINE TESTING W REFLEX): HIV Screen 4th Generation wRfx: NONREACTIVE

## 2023-01-28 LAB — MRSA NEXT GEN BY PCR, NASAL: MRSA by PCR Next Gen: NOT DETECTED

## 2023-01-28 LAB — HEPATITIS B SURFACE ANTIBODY,QUALITATIVE: Hep B S Ab: NONREACTIVE

## 2023-01-28 LAB — HEPATITIS C ANTIBODY: HCV Ab: NONREACTIVE

## 2023-01-28 LAB — HEPATITIS B SURFACE ANTIGEN: Hepatitis B Surface Ag: NONREACTIVE

## 2023-01-28 MED ORDER — INSULIN ASPART 100 UNIT/ML IJ SOLN
0.0000 [IU] | INTRAMUSCULAR | Status: DC
  Administered 2023-01-29: 1 [IU] via SUBCUTANEOUS

## 2023-01-28 MED ORDER — FENTANYL 2500MCG IN NS 250ML (10MCG/ML) PREMIX INFUSION
0.0000 ug/h | INTRAVENOUS | Status: DC
  Administered 2023-01-28: 25 ug/h via INTRAVENOUS
  Filled 2023-01-28: qty 250

## 2023-01-28 MED ORDER — ENOXAPARIN SODIUM 40 MG/0.4ML IJ SOSY
40.0000 mg | PREFILLED_SYRINGE | INTRAMUSCULAR | Status: DC
  Administered 2023-01-28 – 2023-01-29 (×2): 40 mg via SUBCUTANEOUS
  Filled 2023-01-28 (×2): qty 0.4

## 2023-01-28 MED ORDER — DEXMEDETOMIDINE HCL IN NACL 400 MCG/100ML IV SOLN
0.0000 ug/kg/h | INTRAVENOUS | Status: DC
  Filled 2023-01-28: qty 100

## 2023-01-28 MED ORDER — POLYETHYLENE GLYCOL 3350 17 G PO PACK: 17.0000 g | PACK | Freq: Every day | ORAL | Status: DC | PRN

## 2023-01-28 MED ORDER — FAMOTIDINE IN NACL 20-0.9 MG/50ML-% IV SOLN
20.0000 mg | INTRAVENOUS | Status: DC
  Administered 2023-01-28: 20 mg via INTRAVENOUS
  Filled 2023-01-28: qty 50

## 2023-01-28 MED ORDER — ORAL CARE MOUTH RINSE
15.0000 mL | OROMUCOSAL | Status: DC
  Administered 2023-01-28 (×5): 15 mL via OROMUCOSAL

## 2023-01-28 MED ORDER — POTASSIUM CHLORIDE 10 MEQ/100ML IV SOLN
10.0000 meq | INTRAVENOUS | Status: AC
  Administered 2023-01-28 (×4): 10 meq via INTRAVENOUS
  Filled 2023-01-28 (×4): qty 100

## 2023-01-28 MED ORDER — LEVETIRACETAM IN NACL 500 MG/100ML IV SOLN
500.0000 mg | Freq: Two times a day (BID) | INTRAVENOUS | Status: DC
  Administered 2023-01-28 – 2023-01-29 (×3): 500 mg via INTRAVENOUS
  Filled 2023-01-28 (×3): qty 100

## 2023-01-28 MED ORDER — SODIUM CHLORIDE 0.9 % IV BOLUS
1000.0000 mL | Freq: Once | INTRAVENOUS | Status: AC
  Administered 2023-01-28: 1000 mL via INTRAVENOUS

## 2023-01-28 MED ORDER — DOCUSATE SODIUM 100 MG PO CAPS: 100.0000 mg | ORAL_CAPSULE | Freq: Two times a day (BID) | ORAL | Status: DC

## 2023-01-28 MED ORDER — CHLORHEXIDINE GLUCONATE CLOTH 2 % EX PADS
6.0000 | MEDICATED_PAD | Freq: Every day | CUTANEOUS | Status: DC
  Administered 2023-01-29: 6 via TOPICAL

## 2023-01-28 MED ORDER — LAMOTRIGINE 25 MG PO TABS
150.0000 mg | ORAL_TABLET | Freq: Two times a day (BID) | ORAL | Status: DC
  Administered 2023-01-28 (×2): 150 mg
  Filled 2023-01-28 (×2): qty 2

## 2023-01-28 MED ORDER — PROPOFOL 1000 MG/100ML IV EMUL
5.0000 ug/kg/min | INTRAVENOUS | Status: DC
  Administered 2023-01-28: 60 ug/kg/min via INTRAVENOUS
  Filled 2023-01-28: qty 100

## 2023-01-28 MED ORDER — MIDAZOLAM-SODIUM CHLORIDE 100-0.9 MG/100ML-% IV SOLN: 2.0000 mg/h | INTRAVENOUS | Status: DC

## 2023-01-28 MED ORDER — DOCUSATE SODIUM 100 MG PO CAPS: 100.0000 mg | ORAL_CAPSULE | Freq: Two times a day (BID) | ORAL | Status: DC | PRN

## 2023-01-28 MED ORDER — INSULIN ASPART 100 UNIT/ML IJ SOLN: 2.0000 [IU] | INTRAMUSCULAR | Status: DC

## 2023-01-28 MED ORDER — ORAL CARE MOUTH RINSE: 15.0000 mL | OROMUCOSAL | Status: DC | PRN

## 2023-01-28 MED ORDER — POTASSIUM CHLORIDE 10 MEQ/100ML IV SOLN: 10.0000 meq | INTRAVENOUS | Status: DC

## 2023-01-28 MED ORDER — ACETAMINOPHEN 325 MG PO TABS
650.0000 mg | ORAL_TABLET | Freq: Four times a day (QID) | ORAL | Status: DC | PRN
  Administered 2023-01-28 (×2): 650 mg
  Filled 2023-01-28 (×2): qty 2

## 2023-01-28 MED ORDER — DOCUSATE SODIUM 50 MG/5ML PO LIQD
100.0000 mg | Freq: Two times a day (BID) | ORAL | Status: DC
  Administered 2023-01-28: 100 mg
  Filled 2023-01-28: qty 10

## 2023-01-28 MED ORDER — FAMOTIDINE 20 MG PO TABS: 20.0000 mg | ORAL_TABLET | Freq: Every day | ORAL | Status: DC

## 2023-01-28 MED ORDER — LACTATED RINGERS IV SOLN: INTRAVENOUS | Status: AC

## 2023-01-28 NOTE — Consult Note (Signed)
NEUROLOGY CONSULT NOTE   Date of service: January 28, 2023 Patient Name: Diane Kelly MRN:  [id number] DOB:  09/23/2000 Chief Complaint: "Seizures intubated" Requesting Provider: Patrici Ranks, MD  History of Present Illness  Diane Kelly is a 22 y.o. 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 She presented to [facility] after 2 seizures witnessed by significant other. EMS gave her 2mg  IM Versed and brought her to [facility] ED where she was intubated and loaded with Keppra and started on propofol.  She had CT Head w/o contrast with expected post op changes. She was transferred to [facility] Neuro ICU for cEEG.  No fever, lactate of 9 on arrival top [facility], normalized.  No one at bedside to provide any additional hx, patient is intubated and unable to provide and hx.   ROS  Unable to ascertain due to intubation and sedation.  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   Vapes nicotine containing substance     Past Surgical History:  Procedure Laterality Date   APPLICATION OF CRANIAL NAVIGATION N/A 01/25/2023   Procedure: APPLICATION OF CRANIAL NAVIGATION;  Surgeon: Venetia Night, MD;  Location: [facility] ORS;  Service: Neurosurgery;  Laterality: N/A;   CRANIOTOMY Right 01/25/2023   Procedure: RIGHT CRANIOTOMY FOR TUMOR RESECTION;  Surgeon: Venetia Night, MD;  Location: [facility] ORS;  Service: Neurosurgery;  Laterality: Right;   NO PAST SURGERIES      Family History: Family History  Problem Relation Age of Onset   Hypertension Mother    COPD Mother    COPD Maternal  Grandmother    Anxiety disorder Maternal Grandmother    Sleep apnea Maternal Grandmother    COPD Maternal Grandfather    Anxiety disorder Paternal Grandmother    Asthma Paternal Grandmother    Depression Paternal Grandmother     Social History  reports that she has been smoking cigarettes. She uses smokeless tobacco. She reports current alcohol use. She reports that she does not currently use drugs.  No Known Allergies  Medications   Current Facility-Administered Medications:    docusate (COLACE) 50 MG/5ML liquid 100 mg, 100 mg, Per Tube, BID, Albustami, Flonnie Hailstone, MD   enoxaparin (LOVENOX) injection 40 mg, 40 mg, Subcutaneous, Q24H, Albustami, Flonnie Hailstone, MD   famotidine (PEPCID) IVPB 20 mg premix, 20 mg, Intravenous, Q24H, Albustami, Flonnie Hailstone, MD   fentaNYL in NS (67mcg/ml) infusion-PREMIX, 0-400 mcg/hr, Intravenous, Continuous, Albustami, Flonnie Hailstone, MD   insulin aspart (novoLOG) injection 2-6 Units, 2-6 Units, Subcutaneous, Q4H, Albustami, Flonnie Hailstone, MD   lactated ringers infusion, , Intravenous, Continuous, Albustami, Flonnie Hailstone, MD   lamoTRIgine (LAMICTAL) tablet 150 mg, 150 mg, Per Tube, BID, Albustami, Flonnie Hailstone, MD   levETIRAcetam (KEPPRA) IVPB 500 mg/100 mL premix, 500 mg, Intravenous, Q12H, Albustami, Flonnie Hailstone, MD   midazolam (VERSED) 100 mg/100 mL (1 mg/mL) premix infusion, 2 mg/hr, Intravenous, Continuous, Albustami, Flonnie Hailstone, MD   polyethylene glycol (MIRALAX / GLYCOLAX) packet 17 g, 17  g, Per Tube, Daily PRN, Albustami, Flonnie Hailstone, MD   potassium chloride 10 mEq in 100 mL IVPB, 10 mEq, Intravenous, Q1 Hr x 4, Albustami, Flonnie Hailstone, MD   propofol (DIPRIVAN) 1000 MG/100ML infusion, 5-80 mcg/kg/min, Intravenous, Titrated, Albustami, Flonnie Hailstone, MD  Vitals   Vitals:   01/28/23 0110  Weight: 46.8 kg    Body mass index is 18.87 kg/m.  Physical Exam   General: Laying comfortably in bed; in no acute distress despite intubation. HENT: Normal oropharynx and mucosa. Normal external appearance of  ears and nose.  Neck: Supple, no pain or tenderness  CV: No JVD. No peripheral edema.  Pulmonary: Symmetric Chest rise. Breathing over vent. Abdomen: Soft to touch, non-tender.  Ext: No cyanosis, edema, or deformity  Skin: No rash. Healed brusies on BL legs. Straight healed self cuts noted on BL forearms.  Musculoskeletal: Normal digits and nails by inspection. No clubbing.  Neurologic Examination  Mental status/Cognition: intubated and sedated, no response to loud voice or clap, no response to tactile stimuli, facial grimace to noxious stimuli Speech/language: mute, no speech, does not follow commands or answers questions. Cranial nerves:   CN II Pupils equal and reactive to light, unable to assess for VF deficits.   CN III,IV,VI EOM intact to dolls eyes, no gaze preference or deviation.   CN V Corneals intact BL   CN VII Symmetric facial grimace to noxious stimuli   CN VIII Does not turn head towards speech   CN IX & X Cough intact, no gag.   CN XI Head midline, unable to assess shoulder shrug.   CN XII Tongue is midline in her mouth, no obvious bite marks.   Sensory/Motor:  Muscle bulk: normal, tone flaccid in all extremities., tremor none. Withdraws all extremities to proximal pinch.  Coordination/Complex Motor:  Unable to assess.  Labs/Imaging/Neurodiagnostic studies   CBC:  Recent Labs  Lab 02/17/2023 0549 01/27/23 2120  WBC 14.0* 16.7*  NEUTROABS 12.8* 13.6*  HGB 11.7* 12.6  HCT 34.7* 37.3  MCV 87.8 88.4  PLT 245 242   Basic Metabolic Panel:  Lab Results  Component Value Date   NA 134 (L) 01/27/2023   K 3.4 (L) 01/27/2023   CO2 10 (L) 01/27/2023   GLUCOSE 310 (H) 01/27/2023   BUN 14 01/27/2023   CREATININE 0.95 01/27/2023   CALCIUM 8.7 (L) 01/27/2023   GFRNONAA >60 01/27/2023   GFRAA NOT CALCULATED 01/02/2018   Lipid Panel:  Lab Results  Component Value Date   LDLCALC 72 01/06/2018   HgbA1c:  Lab Results  Component Value Date   HGBA1C 4.6 (L)  01/06/2018   Urine Drug Screen:     Component Value Date/Time   LABOPIA NONE DETECTED 01/27/2023 2120   COCAINSCRNUR NONE DETECTED 01/27/2023 2120   LABBENZ POSITIVE (A) 01/27/2023 2120   AMPHETMU NONE DETECTED 01/27/2023 2120   THCU POSITIVE (A) 01/27/2023 2120   LABBARB POSITIVE (A) 01/27/2023 2120    Alcohol Level     Component Value Date/Time   ETH 258 (H) 01/02/2018 2348   INR No results found for: "INR" APTT No results found for: "APTT" AED levels: No results found for: "PHENYTOIN", "ZONISAMIDE", "LAMOTRIGINE", "LEVETIRACETA"  CT Head without contrast(Personally reviewed): 1. Expected postoperative changes from a recent right temporal craniotomy and surgical defect involving the right temporal lobe. 2. No complicating features such as hematoma or brain edema.  MRI Brain(Personally reviewed): pending  Neurodiagnostics cEEG:  pending  ASSESSMENT   Diane Kelly is a  22 y.o. 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 She presented to [facility] after 2 seizures witnessed by significant other. EMS gave her 2mg  IM Versed and brought her to [facility] ED where she was intubated and loaded with Keppra and started on propofol.  She had CT Head w/o contrast with expected post op changes. She was transferred to [facility] Neuro ICU for cEEG.  Unable to verify compliance with AEDs.  UDS positive for Benzos, babriturates and THC. EMS did give her Versed, she did get Fiorecet for headache on 01/26/23 which may explain the noted barbiturates.  RECOMMENDATIONS  - continue Keppra 500 BID. - cEEG - resume Lamictal PO when able. Not available IV. - consider MRI brain after LTM. - continue propofol until hooked up to LTM and if no seizure, can be gradually weaned off. - no driving for 6 months. Has to be seizure free before she can resume  driving. ______________________________________________________________________    Signed, Erick Blinks, MD [facility]

## 2023-01-28 NOTE — Progress Notes (Signed)
NEUROLOGY CONSULT FOLLOW UP NOTE   Date of service: January 28, 2023 Patient Name: Diane Kelly MRN:  [id number] DOB:  01-14-2001  Brief HPI  Diane Kelly is a 22 y.o. 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 She presented to [facility] on 12/4 in the evening after 2 seizures witnessed by significant other. EMS gave her 2mg  IM Versed and brought her to [facility] ED where she was intubated due to agitation and loaded with Keppra, started on propofol. Prior to intubation she was agitated, combative, speaking in word salad. OT note prior to discharge states she was Aox4, appropriate cognition and recall, independent ADLs. She had CT Head w/o contrast with expected post op changes. She was transferred to [facility] Neuro ICU for cEEG. Initial lactic acid was 9, now normalized.    Home AEDs Lamotrigine 150mg  BID   Current AEDs Lamotrigine 150mg  BID (when able to take PO) Keppra 500mg  BID (bridging to PO intake)  Interval Hx/subjective   Intubated, sedated, propofol at 60 and Fentanyl at 25. Lamotrigine level pending. EEG negative for seizures at this time, plan to decrease sedation today.    Vitals   Vitals:   01/28/23 0500 01/28/23 0530 01/28/23 0600 01/28/23 0800  BP: 106/80 104/81 96/69 90/61   Pulse:   (!) 113   Resp: (!) 22 (!) 21 20 17   Temp: (!) 101.3 F (38.5 C) (!) 100.9 F (38.3 C) (!) 100.8 F (38.2 C) 99.7 F (37.6 C)  TempSrc:      SpO2:   100%   Weight:         Body mass index is 18.87 kg/m.  Physical Exam   General: Laying comfortably in bed; in no acute distress despite intubation. HENT: Normal oropharynx and mucosa. Normal external appearance of ears and nose.  Neck: Supple, no pain or tenderness  CV: No JVD. No peripheral edema.  Pulmonary: Symmetric Chest rise. Breathing over vent. Abdomen: Soft to touch, non-tender.  Ext: No cyanosis, edema, or deformity  Skin: No  rash. Healed brusies on BL legs. Straight healed self cuts noted on BL forearms.  Musculoskeletal: Normal digits and nails by inspection. No clubbing.   Neurologic Examination  Mental status/Cognition: intubated and sedated, no response to loud voice or clap, facial grimace to noxious stimuli and localization with noxious stimuli Speech/language: mute, no speech, does not follow commands or answers questions. Cranial nerves:   CN II Pupils equal and reactive to light, unable to assess for VF deficits.   CN III,IV,VI EOM intact to dolls eyes, left eye deviated down initially then corrects to midline    CN V Corneals intact BL   CN VII Symmetric facial grimace to noxious stimuli   CN VIII Does not turn head towards speech   CN IX & X Cough intact, no gag.   CN XI Head midline, unable to assess shoulder shrug.   CN XII Tongue is midline in her mouth, no obvious bite marks.    Sensory/Motor:  Muscle bulk: normal, tone flaccid in all extremities Localizes to painful stimuli, lifts bilateral upper extremities antigravity  Coordination/Complex Motor:  Unable to assess.  Labs and Diagnostic Imaging   CBC:  Recent Labs  Lab 01/26/23 0549 01/27/23 2120 01/28/23 0451  WBC 14.0* 16.7* 16.3*  NEUTROABS 12.8* 13.6*  --   HGB 11.7* 12.6 11.6*  HCT 34.7* 37.3 34.5*  MCV 87.8 88.4 89.1  PLT 245 242 239    Basic Metabolic Panel:  Lab Results  Component Value Date   NA 132 (L) 01/28/2023   K 3.9 01/28/2023   CO2 21 (L) 01/28/2023   GLUCOSE 99 01/28/2023   BUN 18 01/28/2023   CREATININE 1.16 (H) 01/28/2023   CALCIUM 8.5 (L) 01/28/2023   GFRNONAA >60 01/28/2023   GFRAA NOT CALCULATED 01/02/2018   Lipid Panel:  Lab Results  Component Value Date   LDLCALC 72 01/06/2018   HgbA1c:  Lab Results  Component Value Date   HGBA1C 4.6 (L) 01/06/2018   Urine Drug Screen:     Component Value Date/Time   LABOPIA NONE DETECTED 01/27/2023 2120   COCAINSCRNUR NONE DETECTED 01/27/2023 2120    LABBENZ POSITIVE (A) 01/27/2023 2120   AMPHETMU NONE DETECTED 01/27/2023 2120   THCU POSITIVE (A) 01/27/2023 2120   LABBARB POSITIVE (A) 01/27/2023 2120    Alcohol Level     Component Value Date/Time   ETH 258 (H) 01/02/2018 2348   INR No results found for: "INR" APTT No results found for: "APTT" AED levels: No results found for: "PHENYTOIN", "ZONISAMIDE", "LAMOTRIGINE", "LEVETIRACETA"  CT Head without contrast(Personally reviewed): 1. Expected postoperative changes from a recent right temporal craniotomy and surgical defect involving the right temporal lobe. 2. No complicating features such as hematoma or brain edema.  rEEG:  ABNORMALITY - Continuous slow, generalized IMPRESSION: This study is suggestive of severe diffuse encephalopathy likely related to sedation. No seizures or epileptiform discharges were seen throughout the recording.  Impression   Diane Kelly is a 22 y.o. 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 She presented to [facility] after 2 seizures witnessed by significant other. EMS gave her 2mg  IM Versed and brought her to [facility] ED where she was intubated due to agitation and loaded with Keppra, started on propofol.   She had CT Head w/o contrast with expected post op changes. She was transferred to [facility] Neuro ICU for cEEG.    UDS positive for Benzos, babriturates and THC. EMS did give her Versed, she did get Fiorecet for headache on 01/26/23 which may explain the noted barbiturates.  Recommendations  - Decrease propofol by every hour until off  (completed) - Continue lamotrigine 150mg  BID (disregard level, drawn 1 hour after a dose was given) - Continue keppra 500 mg BID for 3 more days, then 250 mg BID for 4 days to bridge through this higher risk period  - If formal EEG report off of sedation negative for subclinical seizures inpatient neurology will  discontinue EEG and sign off - Please do not hesitate to reach out with any questions/concerns - Close outpatient follow-up with her neurologist (Dr. Malvin Johns) ______________________________________________________________________  Attending Neurologist's note:  On my later evaluation at ~5:30 PM, propofol successfully weaned and patient  extubated at ~1 PM without complications. Per nursing mildly irritable but neurologically intact.   Sleeping comfortably on my evaluation.   If formal EEG report off of sedation negative for subclinical seizures will discontinue EEG and sign off.   Note lamotrigine level pending but will not be trough as she got it per tube approximately 1 hour before level drawn.   I personally saw this patient, gathering history, performing a full neurologic examination, reviewing relevant labs, personally reviewing relevant imaging including head CT, and formulated the assessment and plan, adding the note above for completeness and clarity to  accurately reflect my thoughts    Thank you for the opportunity to take part in the care of this patient. If you have any further questions, please contact the neurology consultation team on call. Updated oncall schedule is listed on AMION.  Signed,  Elmer Picker  Attending Neurologist's note:  I personally saw this patient, gathering history, performing a full neurologic examination, reviewing relevant labs, personally reviewing relevant imaging including head CT, and formulated the assessment and plan, adding the note above for completeness and clarity to accurately reflect my thoughts. Recs conveyed to CCM via secure chat   Brooke Dare MD-PhD [facility] [phone] Available 7 AM to 7 PM, outside these hours please contact Neurologist on call listed on AMION

## 2023-01-28 NOTE — Progress Notes (Signed)
LTM EEG disconnected - no skin breakdown at unhook.  

## 2023-01-28 NOTE — Procedures (Signed)
Extubation Procedure Note  Patient Details:   Name: Diane Kelly DOB: 2000/03/15 MRN: [id number]   Airway Documentation:    Vent end date: 01/28/23 Vent end time: 1345   Evaluation  O2 sats: stable throughout Complications: No apparent complications Patient did tolerate procedure well. Bilateral Breath Sounds: Clear   Patient extubated per MD's order, placed on 4L [place] and tolerating well at this time. No stridor noted, patient able to speak post extubation.      Yes     [name] [name] 01/28/2023, 1:51 PM

## 2023-01-28 NOTE — H&P (Signed)
NAME:  Diane Kelly, MRN:  [id number], DOB:  [birth year], LOS: 0 ADMISSION DATE:  01/28/2023, CONSULTATION DATE: 01/28/2023 REFERRING MD: Vicente Males, MD, CHIEF COMPLAINT:  seizures x2  History of Present Illness:  A 22 y.o. female with a past medical history of epilepsy on Lamotrigine and brain tumor (underwent 01/25/2023 right temporal craniotomy for resection) who presented via EMS after 2 witnessed seizure-like events. Patient's significant other witnessed the first after an episode of sexual intercourse.  Patient then had another episode lasting approximately 5 minutes prior to EMS arrival.  EMS gave 2 mg of Versed en route with no further seizure-like activity.  They noted patient to be minimally responsive and postictal throughout transport.  EMS report no external visible signs of trauma. In ED, she was very agitated, confused, and was not able to speak clear sentences. She received 4 mg IV/IM Ativan total and 2 gm of IV Keppra. She was intubated for airway protection. Seen by neuroSx and recommended continuous EEG.   Pertinent  Medical History  Slightly elevated LFT, depression, epilepsy, brain tumor   Significant Hospital Events: Including procedures, antibiotic start and stop dates in addition to other pertinent events   As above  Interim History / Subjective:    Objective   Weight 46.8 kg, last menstrual period 12/30/2022.    Vent Mode: PRVC FiO2 (%):  [25 %-40 %] 40 % Set Rate:  [12 bmp-18 bmp] 15 bmp Vt Set:  [350 mL-375 mL] 350 mL PEEP:  [5 cmH20] 5 cmH20  No intake or output data in the 24 hours ending 01/28/23 0136 Filed Weights   01/28/23 0110  Weight: 46.8 kg    Examination: General: sedated, intubated. SpO2 100%  HENT: PERL. No LNE or thyromegaly. No JVD. Scalp surgical incision looks clean  Lungs: symmetrical air entry bilaterally. No crackles or wheezing Cardiovascular: NL S1/S2. No m/g/r Abdomen: no distension or tenderness Extremities: no edema. Symmetrical   Neuro: sedated    Resolved Hospital Problem list     Assessment & Plan:  Epilepsy on Lamotrigine and brain tumor (underwent 01/25/2023 right temporal craniotomy for resection). She had two witnessed seizures last night -Consult neurology -Already seen by neuroSx -Keppra -Continue Lamotrigine -Continuous EEG -Fall, seizures, and aspiration precautions  Intubated for airway protection -Sedation -VAP bundle and DAP protocol -Daily SBT/awakening  Slightly elevated LFT -Monitor -Liver U/S -Hepatitis panel  Depression -Resume home meds  HypoK -Replace K -Am labs -Check Mg  Lactic acidosis and AGMA, due to muscle activity during seizures. LA has normalized -Am labs  Best Practice (right click and "Reselect all SmartList Selections" daily)   Diet/type: NPO w/ meds via tube DVT prophylaxis: enoxaparin (LOVENOX) injection 40 mg Start: 01/28/23 0215 SCDs Start: 01/28/23 0126   Pressure ulcer(s): not present on admission  GI prophylaxis: H2B Lines: N/A Foley:  N/A Code Status:  full code Last date of multidisciplinary goals of care discussion []  She two PIVs  Labs   CBC: Recent Labs  Lab 01/26/23 0549 01/27/23 2120  WBC 14.0* 16.7*  NEUTROABS 12.8* 13.6*  HGB 11.7* 12.6  HCT 34.7* 37.3  MCV 87.8 88.4  PLT 245 242    Basic Metabolic Panel: Recent Labs  Lab 01/26/23 1057 01/27/23 1815  NA 137 134*  K 4.0 3.4*  CL 106 103  CO2 22 10*  GLUCOSE 121* 310*  BUN 14 14  CREATININE 0.68 0.95  CALCIUM 8.4* 8.7*  MG 2.1 2.6*   GFR: Estimated Creatinine Clearance: 68.6 mL/min (by C-G  formula based on SCr of 0.95 mg/dL). Recent Labs  Lab 01/26/23 0549 01/27/23 1846 01/27/23 2120  WBC 14.0*  --  16.7*  LATICACIDVEN  --  >9.0* 1.6    Liver Function Tests: Recent Labs  Lab 01/26/23 1057 01/27/23 1815  AST 69* 66*  ALT 117* 147*  ALKPHOS 57 61  BILITOT 0.9 0.5  PROT 6.9 7.3  ALBUMIN 3.8 4.1   No results for input(s): "LIPASE", "AMYLASE" in the  last 168 hours. No results for input(s): "AMMONIA" in the last 168 hours.  ABG    Component Value Date/Time   PHART 7.53 (H) 01/27/2023 2137   PCO2ART 24 (L) 01/27/2023 2137   PO2ART 189 (H) 01/27/2023 2137   HCO3 20.1 01/27/2023 2137   ACIDBASEDEF 0.9 01/27/2023 2137   O2SAT 100 01/27/2023 2137     Coagulation Profile: No results for input(s): "INR", "PROTIME" in the last 168 hours.  Cardiac Enzymes: No results for input(s): "CKTOTAL", "CKMB", "CKMBINDEX", "TROPONINI" in the last 168 hours.  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   Surgical History:   Past Surgical History:  Procedure Laterality Date   APPLICATION OF CRANIAL NAVIGATION N/A 01/25/2023   Procedure: APPLICATION OF CRANIAL NAVIGATION;  Surgeon: Venetia Night, MD;  Location: [facility] ORS;  Service: Neurosurgery;  Laterality: N/A;   CRANIOTOMY Right 01/25/2023   Procedure: RIGHT CRANIOTOMY FOR TUMOR RESECTION;  Surgeon: Venetia Night, MD;  Location: [facility] ORS;  Service: Neurosurgery;  Laterality: Right;   NO PAST SURGERIES       Social History:   reports that she has been smoking cigarettes. She uses smokeless tobacco. She reports current alcohol use. She reports that she does not currently use drugs.   Family History:  Her family  history includes Anxiety disorder in her maternal grandmother and paternal grandmother; Asthma in her paternal grandmother; COPD in her maternal grandfather, maternal grandmother, and mother; Depression in her paternal grandmother; Hypertension in her mother; Sleep apnea in her maternal grandmother.   Allergies No Known Allergies   Home Medications  Prior to Admission medications   Medication Sig Start Date End Date Taking? Authorizing Provider  ibuprofen (ADVIL) 200 MG tablet Take 800 mg by mouth every 6 (six) hours as needed for moderate pain (pain score 4-6).   Yes [provider]  lamoTRIgine (LAMICTAL) 150 MG tablet Take 150 mg by mouth 2 (two) times daily. 01/18/23  Yes [provider]  meloxicam (MOBIC) 15 MG tablet Take 15 mg by mouth daily as needed (post-seizure headaches). 11/17/22 11/17/23 Yes [provider]  ondansetron (ZOFRAN-ODT) 4 MG disintegrating tablet Take 1 tablet (4 mg total) by mouth every 8 (eight) hours as needed for nausea or vomiting. 01/27/23  Yes Susanne Borders, PA  oxyCODONE (OXY IR/ROXICODONE) 5 MG immediate release tablet Take 0.5-1 tablets (2.5-5 mg total) by mouth every 4 (four) hours as needed for severe pain (pain score 7-10). 01/27/23  Yes Susanne Borders, PA  senna (SENOKOT) 8.6 MG TABS tablet Take 1 tablet (8.6 mg total) by mouth 2 (two) times daily as needed for mild constipation. 01/27/23  Yes Susanne Borders, PA     Critical care time: 60 min

## 2023-01-28 NOTE — Progress Notes (Signed)
eLink Physician-Brief Progress Note Patient Name: Diane Kelly DOB: 2000/04/04 MRN: [id number]   Date of Service  01/28/2023  HPI/Events of Note  22 year old female with a history of epilepsy and previous temporal craniotomy for tumor resection 2 days prior who returns to the emergency department via EMS for postoperative seizures admitted for continuous EEG monitoring.  Intubated.  On examination, the patient is tachypneic, tachycardic, and normotensive.  She has been loaded with Keppra and currently on midazolam and propofol infusions.  Results consistent with leukocytosis, glucosuria, proteinuria, and talk screen positive for barbiturates, benzodiazepines, and cannabinoids.  CT head with expected postoperative changes.  OG tube in appropriate position.  ET tube in appropriate positioning.  eICU Interventions  Continue antiepileptics per neurology-lamotrigine, Keppra and infusions of Versed/propofol.  Continuous EEG monitoring.  Add Tylenol as needed  DVT prophylaxis with Lovenox GI prophylaxis with famotidine     Intervention Category Evaluation Type: New Patient Evaluation  Eldredge Veldhuizen 01/28/2023, 1:30 AM

## 2023-01-28 NOTE — TOC CM/SW Note (Signed)
Transition of Care [facility]) - Inpatient Brief Assessment   Patient Details  Name: Diane Kelly MRN: [id number] Date of Birth: 01/05/2001  Transition of Care [facility]) CM/SW Contact:    Mearl Latin, LCSW Phone Number: 01/28/2023, 4:55 PM   Clinical Narrative: Patient admitted from home after recent hospital discharge s/p tumor resection. No current TOC needs identified but please place consult if needs arise.    Transition of Care Asessment: Insurance and Status: Insurance coverage has been reviewed Patient has primary care physician: Yes Home environment has been reviewed: From home Prior level of function:: Independent Prior/Current Home Services: No current home services Social Determinants of Health Reivew: SDOH reviewed no interventions necessary Readmission risk has been reviewed: Yes Transition of care needs: no transition of care needs at this time

## 2023-01-28 NOTE — Progress Notes (Signed)
NAME:  Diane Kelly, MRN:  [id number], DOB:  2000-06-12, LOS: 0 ADMISSION DATE:  01/28/2023, CONSULTATION DATE: 01/28/2023 REFERRING MD: Vicente Males, MD, CHIEF COMPLAINT:  seizures x2  History of Present Illness:  A 22 y.o. female with a past medical history of epilepsy on Lamotrigine and brain tumor (underwent 01/25/2023 right temporal craniotomy for resection) who presented via EMS after 2 witnessed seizure-like events. Patient's significant other witnessed the first after an episode of sexual intercourse.  Patient then had another episode lasting approximately 5 minutes prior to EMS arrival.  EMS gave 2 mg of Versed en route with no further seizure-like activity.  They noted patient to be minimally responsive and postictal throughout transport.  EMS report no external visible signs of trauma. In ED, she was very agitated, confused, and was not able to speak clear sentences. She received 4 mg IV/IM Ativan total and 2 gm of IV Keppra. She was intubated for airway protection. Seen by neuroSx and recommended continuous EEG.   Pertinent  Medical History  Slightly elevated LFT, depression, epilepsy, brain tumor   Significant Hospital Events: Including procedures, antibiotic start and stop dates in addition to other pertinent events   12/5 seizures resolved after keppra and versed gtt. Weaning versed. Assessing for readiness to wean  Interim History / Subjective:  Sedated on vent and versed  Objective   Blood pressure 91/68, pulse (!) 113, temperature 99.5 F (37.5 C), resp. rate 18, weight 46.8 kg, last menstrual period 12/30/2022, SpO2 100%.    Vent Mode: PRVC FiO2 (%):  [25 %-40 %] 40 % Set Rate:  [12 bmp-18 bmp] 15 bmp Vt Set:  [350 mL-375 mL] 350 mL PEEP:  [5 cmH20] 5 cmH20 Plateau Pressure:  [10 cmH20-26 cmH20] 10 cmH20   Intake/Output Summary (Last 24 hours) at 01/28/2023 0956 Last data filed at 01/28/2023 0900 Gross per 24 hour  Intake 706.06 ml  Output 600 ml  Net 106.06 ml    Filed Weights   01/28/23 0110  Weight: 46.8 kg    Examination: General heavily sedated on versed HENT NCAT orally intubated.  Pulm clear  Card rrr Abd soft Ext warm  Neuro heavily sedated. PERRL. 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  She had two witnessed seizures 12/4 No surgical intervention warranted per N surg  plan Supportive care  Intubated for airway protection Plan Full vent support VAP bundle Assess for extubation once off versed   Slightly elevated LFT -Liver U/S unremarkable, LFTs improved -Hepatitis panel NR Plan Monitor   Depression Plan Off Buspar per recs of neuro from last hospital stay    Best Practice (right click and "Reselect all SmartList Selections" daily)   Diet/type: NPO w/ meds via tube DVT prophylaxis: enoxaparin (LOVENOX) injection 40 mg Start: 01/28/23 1000 SCDs Start: 01/28/23 0126   Pressure ulcer(s): not present on admission  GI prophylaxis: H2B Lines: N/A Foley:  N/A Code Status:  full code Last date of multidisciplinary goals of care discussion []  She two PIVs  Critical care time: 32 min

## 2023-01-28 NOTE — Progress Notes (Signed)
LTM EEG hooked up and running - no initial skin breakdown - push button tested - [facility] monitoring.  

## 2023-01-28 NOTE — Progress Notes (Signed)
Head only partially cleaned. Pt upset about being woken up for removal and scar running from right ear to top of head with staples. Pt "not sure" if she has other similar spots on head, so removal done gently.

## 2023-01-28 NOTE — Procedures (Addendum)
Patient Name: Diane Kelly  MRN: [id number]  Epilepsy Attending: Charlsie Quest  Referring Physician/Provider: Erick Blinks, MD  Duration: 01/28/2023 0411 to 2204  Patient history: 22 y.o. 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 She presented to [facility] after 2 seizures witnessed by significant other. EEG to evaluate for seizure  Level of alertness:  comatose  AEDs during EEG study: LEV, LTG, propofol  Technical aspects: This EEG study was done with scalp electrodes positioned according to the 10-20 International system of electrode placement. Electrical activity was reviewed with band pass filter of 1-70Hz , sensitivity of 7 uV/mm, display speed of 22mm/sec with a 60Hz  notched filter applied as appropriate. EEG data were recorded continuously and digitally stored.  Video monitoring was available and reviewed as appropriate.  Description: At the beginning of the study, EEG showed continuous generalized 3 to 6 Hz theta-delta slowing admixed with an excessive amount of 15 to 18 Hz beta activity distributed symmetrically and diffusely. Gradually as sedation was weaned, EEG improved and showed posterior dominant rhythm of 8-9 Hz activity of moderate voltage (25-35 uV) seen predominantly in posterior head regions, symmetric and reactive to eye opening and eye closing. Sleep was characterized by vertex waves, sleep spindles (12 to 14 Hz), maximal frontocentral region. EEG also showed continuous 3-5Hz  theta-delta slowing in right temporal region with overriding fast activity consistent with breach artifact. Spikes were also noted in right temporal region.  Hyperventilation and photic stimulation were not performed.     ABNORMALITY - Continuous slow, generalized - Breach artifact, right temporal region - Spikes, right temporal region  IMPRESSION: This study was initially suggestive of severe diffuse encephalopathy likely related  to sedation. Gradually as sedation was weaned, EEG improved. Subsequently EEG was suggestive of cortical dysfunction in right temporal region consistent with underlying craniotomy. Additionally there was evidence of epileptogenicity arising from right temporal region. No seizures were seen throughout the recording.  [name] [name]

## 2023-01-28 NOTE — ED Notes (Signed)
..  EMTALA: REQUIRED DOCUMENTATION COMPLETED AND REVIEWED BY WRITER PRIOR TO PT TRANSFER MD REASSESSMENT EMTALA RN SECTION TRANSFER E-SIGN VS WITHIN REQUIRED TIME

## 2023-01-29 ENCOUNTER — Encounter (HOSPITAL_COMMUNITY): Payer: Self-pay | Admitting: Pulmonary Disease

## 2023-01-29 DIAGNOSIS — R569 Unspecified convulsions: Secondary | ICD-10-CM | POA: Diagnosis not present

## 2023-01-29 LAB — BASIC METABOLIC PANEL
Anion gap: 7 (ref 5–15)
Anion gap: 10 (ref 5–15)
BUN: 20 mg/dL (ref 6–20)
BUN: 16 mg/dL (ref 6–20)
CO2: 21 mmol/L — ABNORMAL LOW (ref 22–32)
CO2: 21 mmol/L — ABNORMAL LOW (ref 22–32)
Calcium: 8.2 mg/dL — ABNORMAL LOW (ref 8.9–10.3)
Calcium: 8.5 mg/dL — ABNORMAL LOW (ref 8.9–10.3)
Chloride: 108 mmol/L (ref 98–111)
Chloride: 108 mmol/L (ref 98–111)
Creatinine, Ser: 1.98 mg/dL — ABNORMAL HIGH (ref 0.44–1.00)
Creatinine, Ser: 1.78 mg/dL — ABNORMAL HIGH (ref 0.44–1.00)
GFR, Estimated: 36 mL/min — ABNORMAL LOW (ref >60)
GFR, Estimated: 41 mL/min — ABNORMAL LOW (ref >60)
Glucose, Bld: 104 mg/dL — ABNORMAL HIGH (ref 70–99)
Glucose, Bld: 110 mg/dL — ABNORMAL HIGH (ref 70–99)
Potassium: 3.8 mmol/L (ref 3.5–5.1)
Potassium: 3.2 mmol/L — ABNORMAL LOW (ref 3.5–5.1)
Sodium: 136 mmol/L (ref 135–145)
Sodium: 139 mmol/L (ref 135–145)

## 2023-01-29 LAB — GLUCOSE, CAPILLARY
Glucose-Capillary: 131 mg/dL — ABNORMAL HIGH (ref 70–99)
Glucose-Capillary: 113 mg/dL — ABNORMAL HIGH (ref 70–99)

## 2023-01-29 LAB — CBC
HCT: 30.2 % — ABNORMAL LOW (ref 36.0–46.0)
Hemoglobin: 10 g/dL — ABNORMAL LOW (ref 12.0–15.0)
MCH: 29.8 pg (ref 26.0–34.0)
MCHC: 33.1 g/dL (ref 30.0–36.0)
MCV: 89.9 fL (ref 80.0–100.0)
Platelets: 174 10*3/uL (ref 150–400)
RBC: 3.36 MIL/uL — ABNORMAL LOW (ref 3.87–5.11)
RDW: 12.7 % (ref 11.5–15.5)
WBC: 10.8 10*3/uL — ABNORMAL HIGH (ref 4.0–10.5)
nRBC: 0 % (ref 0.0–0.2)

## 2023-01-29 LAB — LAMOTRIGINE LEVEL: Lamotrigine Lvl: 4.1 ug/mL (ref 2.0–20.0)

## 2023-01-29 LAB — TRIGLYCERIDES: Triglycerides: 126 mg/dL (ref <150)

## 2023-01-29 MED ORDER — LEVETIRACETAM 500 MG PO TABS: 500.0000 mg | ORAL_TABLET | Freq: Two times a day (BID) | ORAL | 2 refills | Status: DC

## 2023-01-29 MED ORDER — DOCUSATE SODIUM 50 MG/5ML PO LIQD: 100.0000 mg | Freq: Two times a day (BID) | ORAL | Status: DC | PRN

## 2023-01-29 MED ORDER — ACETAMINOPHEN 325 MG PO TABS: 650.0000 mg | ORAL_TABLET | Freq: Four times a day (QID) | ORAL | Status: DC | PRN

## 2023-01-29 MED ORDER — POLYETHYLENE GLYCOL 3350 17 G PO PACK
17.0000 g | PACK | Freq: Every day | ORAL | Status: DC | PRN
Start: 2023-01-29 — End: 2023-01-29

## 2023-01-29 MED ORDER — NICOTINE 7 MG/24HR TD PT24
7.0000 mg | MEDICATED_PATCH | Freq: Every day | TRANSDERMAL | Status: DC
  Administered 2023-01-29: 7 mg via TRANSDERMAL
  Filled 2023-01-29: qty 1

## 2023-01-29 MED ORDER — LACTATED RINGERS IV BOLUS
1000.0000 mL | Freq: Once | INTRAVENOUS | Status: AC
  Administered 2023-01-29: 1000 mL via INTRAVENOUS

## 2023-01-29 MED ORDER — LAMOTRIGINE 25 MG PO TABS
150.0000 mg | ORAL_TABLET | Freq: Two times a day (BID) | ORAL | Status: DC
  Administered 2023-01-29: 150 mg via ORAL
  Filled 2023-01-29: qty 2

## 2023-01-29 MED ORDER — LEVETIRACETAM 500 MG PO TABS: 500.0000 mg | ORAL_TABLET | Freq: Two times a day (BID) | ORAL | Status: DC

## 2023-01-29 NOTE — Discharge Summary (Signed)
Physician Discharge Summary       Patient ID: Diane Kelly MRN: [id number] DOB/AGE: 03/05/2000 22 y.o.  Admission Date: 01/28/2023 Discharge Date: 01/29/2023  Discharge Diagnoses:  Principal Problem:   Seizures (HCC)  History of Present Illness: 22 year old woman with a past medical history of epilepsy on Lamotrigine and brain tumor (underwent 01/25/2023 right temporal craniotomy for resection) who presented via EMS after 2 witnessed seizure-like events. Patient's significant other witnessed the first after an episode of sexual intercourse.  Patient then had another episode lasting approximately 5 minutes prior to EMS arrival.  EMS gave 2mg  of Versed en route with no further seizure-like activity.  They noted patient to be minimally responsive and postictal throughout transport.  EMS report no external visible signs of trauma. In ED, she was very agitated, confused, and was not able to speak clear sentences. She received 4mg  IV/IM Ativan total and 2g of IV Keppra. She was intubated for airway protection. Seen by NSGY and recommended continuous EEG.   Hospital Course:  Ms. Reynold was admitted to [facility] 12/4 via ED for seizures; at the time of ED arrival she was in a post-ictal state and was intubated for airway protection. CT Head was completed demonstrated expected postoperative changes. UDS positive for BZDs, barbituates and THC (received BZDs with EMS and Fioricet for HA 24H prior to admission). She was transferred to [facility] Neuro ICU for LTM EEG. Fortunately, LTM EEG remained negative for seizures. She was extubated 12/5 and LTM EEG discontinued.  Patient continued to progress appropriately and was noted to be tolerating PO and ambulating independently (cleared by PT 12/6) without further seizure activity. Of note, Cr elevated to 1.98 from baseline 0.6-0.8 on day of discharge; AKI presumed prerenal in the setting of decreased PO intake. 1L LR bolus was administered with improvement in Cr.  Patient was deemed medically fit for discharge 12/6 with NSGY follow up scheduled 12/17.  Discharge Plan by Active Problems:   Epilepsy on Lamotrigine, two witnessed seizures 12/4 Brain tumor s/p right temporal craniotomy/resection 12/2 - Neuro/Epilepsy consulted, appreciate recommendations - Seen by NSGY - Continue Keppra, Lamictal at discharge - LTM discontinued 12/5 - Advised in discharge instructions re: avoiding strenuous activity x 2 weeks, smoking/vaping/THC cessation, no driving x 6 months/seizure free and cleared by NSGY/Neuro - Follow up with NSGY 12/17 at [facility])   Slightly elevated LFTs US Abdomen RUQ unremarkable. Hepatitis panel negative. - Monitor as outpatient   Mild AKI, presume prerenal, resolving HypoK, improved - Trend BMP - S/p 1L fluid bolus prior to discharge - Avoid nephrotoxic agents as able - Ensure adequate renal perfusion   Depression - Resume home Lamictal   Significant Hospital Tests/Studies: CT Head 12/4 IMPRESSION: 1. Expected postoperative changes from a recent right temporal craniotomy and surgical defect involving the right temporal lobe. 2. No complicating features such as hematoma or brain edema.  Consults: Neurology/Epilepsy, NSGY  Discharge Exam: BP 107/85   Pulse (!) 101   Temp 98.8 F (37.1 C) (Oral)   Resp 15   Ht 5\' 2"  (1.575 m)   Wt 46.8 kg   LMP 12/30/2022 (Approximate)   SpO2 96%   BMI 18.87 kg/m   General: Overall well-appearing young woman in NAD. HEENT: [place]/AT, anicteric sclera, PERRL, moist mucous membranes. Neuro: Awake, oriented x 4. Responds to verbal stimuli. Following commands consistently. Moves all 4 extremities spontaneously. Strength 5/5 in all 4 extremities.  CV: Mildly tachycardic to 100, regular rhythm, no m/g/r. PULM: Breathing  even and unlabored on RA. Lung fields CTAB. GI: Soft, nontender, nondistended. Normoactive bowel sounds. Extremities: No LE edema noted. Skin: Warm/dry,  no rashes.  Labs at Discharge: Lab Results  Component Value Date   CREATININE 1.78 (H) 01/29/2023   BUN 16 01/29/2023   NA 139 01/29/2023   K 3.2 (L) 01/29/2023   CL 108 01/29/2023   CO2 21 (L) 01/29/2023   Lab Results  Component Value Date   WBC 10.8 (H) 01/29/2023   HGB 10.0 (L) 01/29/2023   HCT 30.2 (L) 01/29/2023   MCV 89.9 01/29/2023   PLT 174 01/29/2023   Lab Results  Component Value Date   ALT 117 (H) 01/28/2023   AST 52 (H) 01/28/2023   ALKPHOS 58 01/28/2023   BILITOT 0.8 01/28/2023   No results found for: "INR", "PROTIME"  Current Radiology Studies: Overnight EEG with video  Result Date: 01/28/2023 Charlsie Quest, MD     01/29/2023  6:08 AM Patient Name: Diane Kelly MRN: [id number] Epilepsy Attending: Charlsie Quest Referring Physician/Provider: Erick Blinks, MD Duration: 01/28/2023 0411 to 2204 Patient history: 22 y.o. 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 She presented to [facility] after 2 seizures witnessed by significant other. EEG to evaluate for seizure Level of alertness:  comatose AEDs during EEG study: LEV, LTG, propofol Technical aspects: This EEG study was done with scalp electrodes positioned according to the 10-20 International system of electrode placement. Electrical activity was reviewed with band pass filter of 1-70Hz , sensitivity of 7 uV/mm, display speed of 38mm/sec with a 60Hz  notched filter applied as appropriate. EEG data were recorded continuously and digitally stored.  Video monitoring was available and reviewed as appropriate. Description: At the beginning of the study, EEG showed continuous generalized 3 to 6 Hz theta-delta slowing admixed with an excessive amount of 15 to 18 Hz beta activity distributed symmetrically and diffusely. Gradually as sedation was weaned, EEG improved and showed posterior dominant rhythm of 8-9 Hz activity of moderate voltage (25-35 uV) seen  predominantly in posterior head regions, symmetric and reactive to eye opening and eye closing. Sleep was characterized by vertex waves, sleep spindles (12 to 14 Hz), maximal frontocentral region. EEG also showed continuous 3-5Hz  theta-delta slowing in right temporal region with overriding fast activity consistent with breach artifact. Spikes were also noted in right temporal region.  Hyperventilation and photic stimulation were not performed.   ABNORMALITY - Continuous slow, generalized - Breach artifact, right temporal region - Spikes, right temporal region IMPRESSION: This study was initially suggestive of severe diffuse encephalopathy likely related to sedation. Gradually as sedation was weaned, EEG improved. Subsequently EEG was suggestive of cortical dysfunction in right temporal region consistent with underlying craniotomy. Additionally there was evidence of epileptogenicity arising from right temporal region. No seizures were seen throughout the recording. Priyanka [name]   US Abdomen Limited RUQ (LIVER/GB)  Result Date: 01/28/2023 CLINICAL DATA:  Acute encephalopathy. EXAM: ULTRASOUND ABDOMEN LIMITED RIGHT UPPER QUADRANT COMPARISON:  None Available. FINDINGS: Gallbladder: No gallstones or wall thickening visualized. No sonographic Murphy sign noted by sonographer. Common bile duct: Diameter: 2 mm Liver: No focal lesion identified. Within normal limits in parenchymal echogenicity. Portal vein is patent on color Doppler imaging with normal direction of blood flow towards the liver. Other: None. IMPRESSION: Unremarkable right upper quadrant ultrasound. Electronically Signed   By: Kennith Center M.D.   On: 01/28/2023 06:19   CT Head Wo Contrast  Result Date: 01/27/2023 CLINICAL DATA:  History of recent surgical resection of a right temporal lobe lesion. Seizure activity at home. EXAM: CT HEAD WITHOUT CONTRAST TECHNIQUE: Contiguous axial images were obtained from the base of the skull through the vertex  without intravenous contrast. RADIATION DOSE REDUCTION: This exam was performed according to the departmental dose-optimization program which includes automated exposure control, adjustment of the mA and/or kV according to patient size and/or use of iterative reconstruction technique. COMPARISON:  Multiple prior imaging studies. Most recent MRI is 12/07/2022 FINDINGS: Brain: Expected postoperative changes from a recent right temporal craniotomy and surgical defect involving the right temporal lobe. Subdural air is not an unexpected finding. No evidence of hematoma, mass effect or shift of the midline structures. The ventricles are in the midline appears stable in size and configuration to the preoperative studies. The brainstem and cerebellum are unremarkable. Vascular: No hyperdense vessel or unexpected calcification. Skull: Surgical changes from a right temporal craniotomy. Sinuses/Orbits: The paranasal sinuses and mastoid air cells are clear. The globes are intact. Other: No scalp lesions or scalp hematoma. IMPRESSION: 1. Expected postoperative changes from a recent right temporal craniotomy and surgical defect involving the right temporal lobe. 2. No complicating features such as hematoma or brain edema. Electronically Signed   By: Rudie Meyer M.D.   On: 01/27/2023 22:21   DG Abd Portable 1 View  Result Date: 01/27/2023 CLINICAL DATA:  NG tube placement. EXAM: PORTABLE ABDOMEN - 1 VIEW COMPARISON:  None Available. FINDINGS: The NG tube tip is in the body region of the stomach. The visualized lungs are clear. The bowel gas pattern is unremarkable. IMPRESSION: NG tube tip is in the body region of the stomach. Electronically Signed   By: Rudie Meyer M.D.   On: 01/27/2023 22:14   DG Chest Portable 1 View  Result Date: 01/27/2023 CLINICAL DATA:  Intubation. EXAM: PORTABLE CHEST 1 VIEW COMPARISON:  None Available. FINDINGS: The endotracheal tube tip is 18 mm above the carina. The NG tube is coursing down  the esophagus and into the stomach. The heart is normal in size. The mediastinal hilar contours are normal. The lungs are clear. The bony structures are intact. IMPRESSION: 1. Endotracheal tube tip 18 mm above the carina. 2. No acute cardiopulmonary findings. Electronically Signed   By: Rudie Meyer M.D.   On: 01/27/2023 22:13    Disposition:  Discharge disposition: 01-Home or Self Care       Discharge Instructions     Call MD for:   Complete by: As directed    Recurrent seizure activity   Call MD for:  difficulty breathing, headache or visual disturbances   Complete by: As directed    Call MD for:  extreme fatigue   Complete by: As directed    Call MD for:  hives   Complete by: As directed    Call MD for:  persistant dizziness or light-headedness   Complete by: As directed    Call MD for:  persistant nausea and vomiting   Complete by: As directed    Call MD for:  redness, tenderness, or signs of infection (pain, swelling, redness, odor or green/yellow discharge around incision site)   Complete by: As directed    Call MD for:  severe uncontrolled pain   Complete by: As directed    Call MD for:  temperature >100.4   Complete by: As directed    Diet general   Complete by: As directed    Discharge instructions  Complete by: As directed    You were admitted to the hospital for seizures after your recent brain surgery.  We suspect this was related to strenuous activity post-surgery.  Please avoid any strenuous activity for the next 2 weeks; this includes exercise, significant housework, and sexual intercourse.  Additionally, we recommend you do not smoke or vape.  Do not drive until you are cleared by your neurosurgery team.  Please review your medication list as 1 new medication called Keppra has been added to your list, this is an antiepileptic/antiseizure drug to prevent further seizures.  If you have persistent sore throat, you may use lozenges or Chloraseptic spray for  symptomatic relief. follow-up with your neurosurgery team as scheduled.   Discharge wound care:   Complete by: As directed    Keep your surgical site clean and dry and contact your neurosurgery team if you notice any redness, swelling or drainage.   Increase activity slowly   Complete by: As directed       Allergies as of 01/29/2023   No Known Allergies      Medication List     TAKE these medications    ibuprofen 200 MG tablet Commonly known as: ADVIL Take 800 mg by mouth every 6 (six) hours as needed for moderate pain (pain score 4-6).   lamoTRIgine 150 MG tablet Commonly known as: LAMICTAL Take 150 mg by mouth 2 (two) times daily.   levETIRAcetam 500 MG tablet Commonly known as: KEPPRA Take 1 tablet (500 mg total) by mouth 2 (two) times daily.   meloxicam 15 MG tablet Commonly known as: MOBIC Take 15 mg by mouth daily as needed (post-seizure headaches).   MIDOL COMPLETE PO Take 2 tablets by mouth 2 (two) times daily as needed (Pain).   ondansetron 4 MG disintegrating tablet Commonly known as: ZOFRAN-ODT Take 1 tablet (4 mg total) by mouth every 8 (eight) hours as needed for nausea or vomiting.   oxyCODONE 5 MG immediate release tablet Commonly known as: Oxy IR/ROXICODONE Take 0.5-1 tablets (2.5-5 mg total) by mouth every 4 (four) hours as needed for severe pain (pain score 7-10).   senna 8.6 MG Tabs tablet Commonly known as: SENOKOT Take 1 tablet (8.6 mg total) by mouth 2 (two) times daily as needed for mild constipation.               Discharge Care Instructions  (From admission, onward)           Start     Ordered   01/29/23 0000  Discharge wound care:       Comments: Keep your surgical site clean and dry and contact your neurosurgery team if you notice any redness, swelling or drainage.   01/29/23 1321           Discharged Condition: good  44 minutes of time have been dedicated to discharge assessment, planning and discharge  instructions.   Signed:  Faythe Ghee [facility] 01/29/23 1:26 PM  Please see [email or web address] for pager details.  From 7A-7P if no response, please call [phone] After hours, please call ELink [phone]

## 2023-01-29 NOTE — Evaluation (Signed)
Physical Therapy Evaluation Patient Details Name: Diane Kelly MRN: [id number] DOB: [birth year] Today's Date: 01/29/2023  History of Present Illness  22 yo female s/p R craniotomy for resection of R temporal lesion on 12/2, d/c 12/4. Pt re-presents to ED on 12/4 for seizures.ETT 12/4-12/5. PMH includes slightly elevated LFT, depression, epilepsy.  Clinical Impression   Pt presents with impulsivity and poor safety awareness (unsure of pt baseline), min impaired balance, but WFL and symmetric strength. Pt to benefit from acute PT to address deficits. Pt ambulated hallway distance with supervision for safety, pt with weaving of gait with head turns and directional changes and pt seemingly unaware, no overt LOB. PT pointed out min balance deficits, both pt and pt's significant other receptive to education. PT to progress mobility as tolerated, and will continue to follow acutely.          If plan is discharge home, recommend the following: Assist for transportation   Can travel by private vehicle        Equipment Recommendations None recommended by PT  Recommendations for Other Services       Functional Status Assessment Patient has had a recent decline in their functional status and demonstrates the ability to make significant improvements in function in a reasonable and predictable amount of time.     Precautions / Restrictions Precautions Precautions: Fall Restrictions Weight Bearing Restrictions: No      Mobility  Bed Mobility Overal bed mobility: Modified Independent                  Transfers Overall transfer level: Modified independent                      Ambulation/Gait Ambulation/Gait assistance: Supervision Gait Distance (Feet): 250 Feet Assistive device: None Gait Pattern/deviations: Step-through pattern, Drifts right/left Gait velocity: normal     General Gait Details: min drifting with head turns, but pt-corrected  Stairs             Wheelchair Mobility     Tilt Bed    Modified Rankin (Stroke Patients Only)       Balance Overall balance assessment: Needs assistance Sitting-balance support: No upper extremity supported, Feet supported Sitting balance-Leahy Scale: Good     Standing balance support: No upper extremity supported, During functional activity Standing balance-Leahy Scale: Good                 High Level Balance Comments: min weaving of gait with head turns, no LOB or overt unsteadiness. PT called pt's attention to this, seems unbothered             Pertinent Vitals/Pain Pain Assessment Pain Assessment: No/denies pain Pain Intervention(s): Monitored during session, Limited activity within patient's tolerance    Home Living Family/patient expects to be discharged to:: Private residence Living Arrangements: Spouse/significant other;Other relatives Available Help at Discharge: Family Type of Home: House Home Access: Level entry       Home Layout: One level Home Equipment: None      Prior Function Prior Level of Function : Independent/Modified Independent             Mobility Comments: does not drive. last seizure was last week. independent at baseline without device ADLs Comments: independent     Extremity/Trunk Assessment   Upper Extremity Assessment Upper Extremity Assessment: Overall WFL for tasks assessed    Lower Extremity Assessment Lower Extremity Assessment: Overall WFL for tasks assessed    Cervical / Trunk  Assessment Cervical / Trunk Assessment: Normal  Communication   Communication Communication: No apparent difficulties Cueing Techniques: Verbal cues;Gestural cues  Cognition Arousal: Alert Behavior During Therapy: WFL for tasks assessed/performed, Impulsive Overall Cognitive Status: Within Functional Limits for tasks assessed Area of Impairment: Safety/judgement                         Safety/Judgement: Decreased awareness of  deficits, Decreased awareness of safety     General Comments: slightly impulsive, requires cues to wait for assist. min unsteadiness, pt does not notice        General Comments      Exercises     Assessment/Plan    PT Assessment Patient needs continued PT services  PT Problem List Decreased balance;Decreased safety awareness;Decreased activity tolerance       PT Treatment Interventions Therapeutic activities;Gait training;Therapeutic exercise;Patient/family education;Balance training;Stair training;Functional mobility training;Neuromuscular re-education    PT Goals (Current goals can be found in the Care Plan section)  Acute Rehab PT Goals PT Goal Formulation: With patient Time For Goal Achievement: 02/12/23 Potential to Achieve Goals: Good    Frequency Min 1X/week     Co-evaluation               AM-PAC PT "6 Clicks" Mobility  Outcome Measure Help needed turning from your back to your side while in a flat bed without using bedrails?: None Help needed moving from lying on your back to sitting on the side of a flat bed without using bedrails?: None Help needed moving to and from a bed to a chair (including a wheelchair)?: None Help needed standing up from a chair using your arms (e.g., wheelchair or bedside chair)?: None Help needed to walk in hospital room?: A Little Help needed climbing 3-5 steps with a railing? : A Little 6 Click Score: 22    End of Session   Activity Tolerance: Patient tolerated treatment well Patient left: in bed;with call bell/phone within reach;with family/visitor present Nurse Communication: Mobility status PT Visit Diagnosis: Other abnormalities of gait and mobility (R26.89)    Time: 1610-9604 PT Time Calculation (min) (ACUTE ONLY): 13 min   Charges:   PT Evaluation $PT Eval Low Complexity: 1 Low   PT General Charges $$ ACUTE PT VISIT: 1 Visit         Marye Round, PT DPT Acute Rehabilitation Services Secure Chat Preferred   Office [phone]   Diane Kelly 01/29/2023, 1:10 PM

## 2023-01-29 NOTE — Progress Notes (Signed)
NAME:  Diane Kelly, MRN:  [id number], DOB:  2001-01-25, LOS: 1 ADMISSION DATE:  01/28/2023, CONSULTATION DATE: 01/28/2023 REFERRING MD: Vicente Males, MD, CHIEF COMPLAINT:  Seizures x 2  History of Present Illness:  22 year old woman with a past medical history of epilepsy on Lamotrigine and brain tumor (underwent 01/25/2023 right temporal craniotomy for resection) who presented via EMS after 2 witnessed seizure-like events. Patient's significant other witnessed the first after an episode of sexual intercourse.  Patient then had another episode lasting approximately 5 minutes prior to EMS arrival.  EMS gave 2mg  of Versed en route with no further seizure-like activity.  They noted patient to be minimally responsive and postictal throughout transport.  EMS report no external visible signs of trauma. In ED, she was very agitated, confused, and was not able to speak clear sentences. She received 4mg  IV/IM Ativan total and 2g of IV Keppra. She was intubated for airway protection. Seen by NSGY and recommended continuous EEG.   Pertinent Medical History:  Slightly elevated LFT, depression, epilepsy, brain tumor   Significant Hospital Events: Including procedures, antibiotic start and stop dates in addition to other pertinent events   12/4 - Admitted for seizure activity post-R temporal craniotomy/tumor resection. Intubated for airway protection. LTM EEG started. 12/5 - LTM EEG with epileptogenicity R temporal region, no seizures. Extubated. LTM discontinued.  Interim History / Subjective:  No significant events overnight Much improved clinically, feels well Ambulated in hall with PT today, no dizziness/weakness Eager to go home Mild AKI with Cr 1.98, suspect dry/prerenal, will give 1L LR bolus and recheck early afternoon If labs improved, d/c home today  Objective:  Blood pressure 98/67, pulse 91, temperature 100 F (37.8 C), resp. rate 18, weight 46.8 kg, last menstrual period 12/30/2022, SpO2  100%.    Vent Mode: PRVC FiO2 (%):  [40 %] 40 % Set Rate:  [15 bmp] 15 bmp Vt Set:  [350 mL] 350 mL PEEP:  [5 cmH20] 5 cmH20 Plateau Pressure:  [6 cmH20-10 cmH20] 6 cmH20   Intake/Output Summary (Last 24 hours) at 01/29/2023 0744 Last data filed at 01/29/2023 0600 Gross per 24 hour  Intake 2389.41 ml  Output 1475 ml  Net 914.41 ml   Filed Weights   01/28/23 0110  Weight: 46.8 kg   Physical Examination: General: Overall well-appearing young woman in NAD. Pleasant and conversant. HEENT: [place]/AT, anicteric sclera, PERRL, moist mucous membranes. Neuro: Awake, oriented x 4. Responds to verbal stimuli. Following commands consistently. Moves all 4 extremities spontaneously. Strength 5/5 in all 4 extremities.  CV: Mildly tachycardic, regular rhythm, no m/g/r. PULM: Breathing even and unlabored on RA. Lung fields CTAB. GI: Soft, nontender, nondistended. Normoactive bowel sounds. Extremities: No LE edema noted. Skin: Warm/dry, no rashes.  Resolved Hospital Problem List:   Lactic acidosis AGMA Intubation for airway protection, extubated 12/5  Assessment & Plan:  Epilepsy on Lamotrigine, two witnessed seizures 12/4 Brain tumor s/p right temporal craniotomy/resection 12/2 - Neuro/Epilepsy consulted, appreciate recommendations - Seen by NSGY - Continue Keppra, lamotrigine - LTM EEG discontinued 12/5 - Fall/seizure/aspiration precautions  Slightly elevated LFTs US Abdomen RUQ unremarkable. Hepatitis panel negative. - Monitor as outpatient  Mild AKI, presume prerenal HypoK, improved - Trend BMP - Replete electrolytes as indicated - Monitor I&Os - Avoid nephrotoxic agents as able - Ensure adequate renal perfusion - 1L LR bolus now, recheck 1200  Depression - Resume home medications (Lamictal)  Significantly clinically improved; provided Cr is moving the right direction this afternoon will likely discharge home  today.  Best Practice: (right click and "Reselect all SmartList  Selections" daily)   Diet/type: Regular consistency (see orders) DVT prophylaxis: enoxaparin (LOVENOX) injection 40 mg Start: 01/28/23 1000 SCDs Start: 01/28/23 0126 Pressure ulcer(s): not present on admission  GI prophylaxis: H2B Lines: N/A Foley:  N/A Code Status:  full code Last date of multidisciplinary goals of care discussion []  She two PIVs  Signature:   Tim Lair, PA-C [facility] 01/29/23 7:45 AM  Please see [email or web address] for pager details.  From 7A-7P if no response, please call [phone] After hours, please call ELink [phone]

## 2023-02-09 ENCOUNTER — Other Ambulatory Visit
Admission: RE | Admit: 2023-02-09 | Discharge: 2023-02-09 | Disposition: A | Discharge status: Home / Self Care | Payer: Medicaid Other | Source: Ambulatory Visit | Attending: Neurosurgery | Admitting: Neurosurgery

## 2023-02-09 ENCOUNTER — Ambulatory Visit (INDEPENDENT_AMBULATORY_CARE_PROVIDER_SITE_OTHER): Payer: Medicaid Other | Admitting: Neurosurgery

## 2023-02-09 VITALS — BP 110/72 | Temp 98.1°F

## 2023-02-09 DIAGNOSIS — Z09 Encounter for follow-up examination after completed treatment for conditions other than malignant neoplasm: Secondary | ICD-10-CM

## 2023-02-09 DIAGNOSIS — R7989 Other specified abnormal findings of blood chemistry: Secondary | ICD-10-CM | POA: Insufficient documentation

## 2023-02-09 DIAGNOSIS — D496 Neoplasm of unspecified behavior of brain: Secondary | ICD-10-CM

## 2023-02-09 DIAGNOSIS — R569 Unspecified convulsions: Secondary | ICD-10-CM

## 2023-02-09 LAB — COMPREHENSIVE METABOLIC PANEL
ALT: 22 U/L (ref 0–44)
AST: 14 U/L — ABNORMAL LOW (ref 15–41)
Albumin: 4.4 g/dL (ref 3.5–5.0)
Alkaline Phosphatase: 72 U/L (ref 38–126)
Anion gap: 9 (ref 5–15)
BUN: 17 mg/dL (ref 6–20)
CO2: 26 mmol/L (ref 22–32)
Calcium: 9.6 mg/dL (ref 8.9–10.3)
Chloride: 103 mmol/L (ref 98–111)
Creatinine, Ser: 0.99 mg/dL (ref 0.44–1.00)
GFR, Estimated: >60 mL/min (ref >60)
Glucose, Bld: 98 mg/dL (ref 70–99)
Potassium: 4 mmol/L (ref 3.5–5.1)
Sodium: 138 mmol/L (ref 135–145)
Total Bilirubin: 0.4 mg/dL (ref <1.2)
Total Protein: 7.4 g/dL (ref 6.5–8.1)

## 2023-02-09 NOTE — Progress Notes (Signed)
   REFERRING PHYSICIAN:  Alm Bustard, Np [address] [place],  [place] [address]  DOS: 01/25/23 right craniotomy for tumor resection Final path pending consultation with [facility]  HISTORY OF PRESENT ILLNESS: Diane Kelly is about 2 weeks status post craniotomy for tumor resection.  She was discharged on postop day 2 but experienced 2 seizures at home.  She was agitated and required sedation and ultimately resulted in intubation.  She was transferred to [facility] for further treatment in the neuro ICU and EEG monitoring.  She also experienced an AKI which improved with fluid resuscitation.  She was ultimately able to be extubated on 12/5 and was discharged home on 12/6.  In addition to the above information, she was noted to have elevated LFTs postoperatively.  Pharmacy was consulted and felt like this was likely due to hepatotoxic medications.  Her home BuSpar was stopped at the recommendation of pharmacy. Today she reports feeling somewhat shaky although she has not eaten yet today.  She has been taking oxycodone and ibuprofen as needed for pain.  She describes some incisional soreness but is otherwise doing well.  PHYSICAL EXAMINATION:  NEUROLOGICAL:  General: In no acute distress.   Awake, alert, oriented to person, place, and time.  Pupils equal round and reactive to light.  Facial tone is symmetric.  Tongue protrusion is midline.  There is no pronator drift.  Strength: Side Biceps Triceps Deltoid Interossei Grip Wrist Ext. Wrist Flex.  R 5 5 5 5 5 5 5   L 5 5 5 5 5 5 5    Incision c/d/I  Imaging:  01/27/23 CT head  FINDINGS: Brain: Expected postoperative changes from a recent right temporal craniotomy and surgical defect involving the right temporal lobe. Subdural air is not an unexpected finding. No evidence of hematoma, mass effect or shift of the midline structures. The ventricles are in the midline appears stable in size and configuration to the preoperative studies.  The brainstem and cerebellum are unremarkable.   Vascular: No hyperdense vessel or unexpected calcification.   Skull: Surgical changes from a right temporal craniotomy.   Sinuses/Orbits: The paranasal sinuses and mastoid air cells are clear. The globes are intact.   Other: No scalp lesions or scalp hematoma.   IMPRESSION: 1. Expected postoperative changes from a recent right temporal craniotomy and surgical defect involving the right temporal lobe. 2. No complicating features such as hematoma or brain edema.     Electronically Signed   By: Rudie Meyer M.D.   On: 01/27/2023 22:21  Assessment / Plan: Diane Kelly is doing well after recent craniotomy despite above postoperative complications. We discussed the importance of continuing with high-protein diet as she did express some decreased appetite.  I also instructed her on washing her hair.  We discussed activity escalation and I have advised the patient to lift up to 10 pounds until 6 weeks after surgery, then increase up to 25 pounds until 12 weeks after surgery.  After 12 weeks post-op, the patient advised to increase activity as tolerated.  We will obtain an updated CMP given her persistent LFT elevation on lab work done at [facility]. she will return to clinic in approximately 4 weeks to see Dr. Myer Haff or sooner should she have any questions or concerns.  She expressed understanding and was in agreement with this plan.    Advised to contact the office if any questions or concerns arise.   Manning Charity PA-C Dept of Neurosurgery

## 2023-02-23 ENCOUNTER — Telehealth: Payer: Self-pay | Admitting: Neurosurgery

## 2023-02-23 DIAGNOSIS — D496 Neoplasm of unspecified behavior of brain: Secondary | ICD-10-CM

## 2023-02-23 DIAGNOSIS — R569 Unspecified convulsions: Secondary | ICD-10-CM

## 2023-02-23 NOTE — Telephone Encounter (Signed)
 I spoke to her significant other regarding her pathology results.  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  This is consistent with our preoperative expectations.  I will put in a referral to Dr. Barbaraann Cao.

## 2023-03-08 ENCOUNTER — Encounter: Payer: Self-pay | Admitting: Neurosurgery

## 2023-03-09 ENCOUNTER — Encounter: Payer: Medicaid Other | Admitting: Neurosurgery

## 2023-03-11 ENCOUNTER — Ambulatory Visit: Payer: Medicaid Other | Admitting: Neurosurgery

## 2023-03-11 VITALS — BP 112/70 | Ht 62.0 in | Wt 103.0 lb

## 2023-03-11 DIAGNOSIS — Z09 Encounter for follow-up examination after completed treatment for conditions other than malignant neoplasm: Secondary | ICD-10-CM

## 2023-03-11 DIAGNOSIS — D496 Neoplasm of unspecified behavior of brain: Secondary | ICD-10-CM

## 2023-03-11 NOTE — Progress Notes (Signed)
   REFERRING PHYSICIAN:  Alm Bustard, Np [address] [place],  [place] [address]  DOS: 01/25/23 right craniotomy for tumor resection Final path pending consultation with [facility]  HISTORY OF PRESENT ILLNESS: Diane Kelly is status post craniotomy for tumor resection.    She is doing very well.  She had a postoperative seizure on postoperative day 2, but has had no significant seizures since then.  She used to have auras very frequently.  She has not had one for quite some time.   PHYSICAL EXAMINATION:  NEUROLOGICAL:  General: In no acute distress.   Awake, alert, oriented to person, place, and time.  Pupils equal round and reactive to light.  Facial tone is symmetric.  Tongue protrusion is midline.  There is no pronator drift.  Strength: Side Biceps Triceps Deltoid Interossei Grip Wrist Ext. Wrist Flex.  R 5 5 5 5 5 5 5   L 5 5 5 5 5 5 5    Incision c/d/I  Imaging:  01/27/23 CT head  FINDINGS: Brain: Expected postoperative changes from a recent right temporal craniotomy and surgical defect involving the right temporal lobe. Subdural air is not an unexpected finding. No evidence of hematoma, mass effect or shift of the midline structures. The ventricles are in the midline appears stable in size and configuration to the preoperative studies. The brainstem and cerebellum are unremarkable.   Vascular: No hyperdense vessel or unexpected calcification.   Skull: Surgical changes from a right temporal craniotomy.   Sinuses/Orbits: The paranasal sinuses and mastoid air cells are clear. The globes are intact.   Other: No scalp lesions or scalp hematoma.   IMPRESSION: 1. Expected postoperative changes from a recent right temporal craniotomy and surgical defect involving the right temporal lobe. 2. No complicating features such as hematoma or brain edema.     Electronically Signed   By: Rudie Meyer M.D.   On: 01/27/2023 22:21  Assessment / Plan: Diane Kelly  is doing well after recent craniotomy despite above postoperative complications.     She will see Dr. Barbaraann Cao in neuro-oncology next week.  I think that her temporalis discomfort will continue to improve.  Will defer further decision-making on treatment to Dr. Barbaraann Cao.     Advised to contact the office if any questions or concerns arise.   Venetia Night MD Dept of Neurosurgery

## 2023-03-19 ENCOUNTER — Inpatient Hospital Stay: Payer: Medicaid Other | Attending: Internal Medicine | Admitting: Internal Medicine

## 2023-03-19 ENCOUNTER — Encounter: Payer: Self-pay | Admitting: Internal Medicine

## 2023-03-19 VITALS — BP 138/101 | HR 97 | Temp 98.6°F | Resp 20 | Wt 102.2 lb

## 2023-03-19 DIAGNOSIS — C719 Malignant neoplasm of brain, unspecified: Secondary | ICD-10-CM | POA: Diagnosis present

## 2023-03-19 DIAGNOSIS — Z79899 Other long term (current) drug therapy: Secondary | ICD-10-CM | POA: Diagnosis not present

## 2023-03-19 DIAGNOSIS — F17210 Nicotine dependence, cigarettes, uncomplicated: Secondary | ICD-10-CM | POA: Diagnosis not present

## 2023-03-19 DIAGNOSIS — R569 Unspecified convulsions: Secondary | ICD-10-CM | POA: Diagnosis not present

## 2023-03-19 NOTE — Progress Notes (Signed)
[facility] [address]  [place], [place] [address] [phone]   New Patient Evaluation  Date of Service: 03/19/23 Patient Name: Diane Kelly Patient MRN: [id number] Patient DOB: 21-Apr-2000 Provider: Henreitta Leber, MD  Identifying Statement:  Diane Kelly is a 23 y.o. 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    Referring Provider: Venetia Night, MD [address] [place],  [place] [address]  Oncologic History 01/27/23: Right temporal craniotomy, resection by Dr. Marcell Barlow.  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  Diane Kelly presented to neurologic attention initially at age 59, with new onset seizures.  These were/are described as "eyes rolling back, disconnected for several minutes, followed by brief confusion".  In 2023, MRI brain demonstrated a lesion with the right temporal lobe suspicious for primary brain tumor.  Repeat scan in 2024 demonstrated slight growth.  Diane Kelly was referred to neurosurgery, underwent resection with Dr. Marcell Barlow on 01/27/23 without complication.  There was a post-surgical bout of status epilepticus, but Diane Kelly has been seizure free since that time.  Currently dosing lamictal 150mg  twice per day and Keppra 500mg  twice per day.  Medications: Current Outpatient Medications on File Prior to Visit  Medication Sig Dispense Refill   ibuprofen (ADVIL) 200 MG tablet Take 800 mg by mouth every 6 (six) hours as needed for moderate pain (pain score 4-6).     lamoTRIgine (LAMICTAL) 150 MG tablet Take 150 mg by mouth 2 (two) times daily.     levETIRAcetam  (KEPPRA) 500 MG tablet Take 1 tablet (500 mg total) by mouth 2 (two) times daily. 60 tablet 2   No current facility-administered medications on file prior to visit.    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)    Anemia    Anxiety    Brain tumor (HCC)    Cannabis abuse    Deliberate self-cutting    Depression    DMDD (disruptive mood dysregulation disorder) (HCC)    Epilepsy (HCC)    last seizure 12-10-22   Heart murmur    asymptomatic   Nicotine abuse    Right temporal lobe mass 01/14/2022   Vapes nicotine containing substance    Past Surgical History:  Past Surgical History:  Procedure Laterality Date   APPLICATION OF CRANIAL NAVIGATION N/A 01/25/2023   Procedure: APPLICATION OF CRANIAL NAVIGATION;  Surgeon: Venetia Night, MD;  Location: [facility] ORS;  Service: Neurosurgery;  Laterality: N/A;   CRANIOTOMY Right 01/25/2023   Procedure: RIGHT CRANIOTOMY FOR TUMOR RESECTION;  Surgeon: Venetia Night, MD;  Location: [facility] ORS;  Service: Neurosurgery;  Laterality: Right;   NO PAST SURGERIES     Social History:  Social History   Socioeconomic History   Marital status: Single    Spouse name: Not on file   Number of children: Not on file   Years of education: Not on file   Highest education level: Not on file  Occupational History   Not on file  Tobacco Use   Smoking status: Some Days    Current packs/day: 0.50  Types: Cigarettes   Smokeless tobacco: Current  Vaping Use   Vaping status: Every Day   Substances: Nicotine, Flavoring  Substance and Sexual Activity   Alcohol use: Yes    Comment: occ   Drug use: Not Currently    Comment: cbd   Sexual activity: Yes    Birth control/protection: Condom  Other Topics Concern   Not on file  Social History Narrative   Not on file   Social Drivers of Health   Financial Resource Strain: Low Risk  (01/20/2023)    Received from [facility]   Overall Financial Resource Strain (CARDIA)    Difficulty of Paying Living Expenses: Not hard at all  Food Insecurity: No Food Insecurity (01/29/2023)   Hunger Vital Sign    Worried About Running Out of Food in the Last Year: Never true    Ran Out of Food in the Last Year: Never true  Transportation Needs: No Transportation Needs (01/29/2023)   PRAPARE - Administrator, Civil Service (Medical): No    Lack of Transportation (Non-Medical): No  Physical Activity: Not on file  Stress: Not on file  Social Connections: Unknown (07/07/2022)   Received from [facility] Short Social Needs Screening - Social Connection    Would you like help with any of the following needs: food, medicine/medical supplies, transportation, loneliness, housing or utilities?: Not on file  Intimate Partner Violence: Not At Risk (01/29/2023)   Humiliation, Afraid, Rape, and Kick questionnaire    Fear of Current or Ex-Partner: No    Emotionally Abused: No    Physically Abused: No    Sexually Abused: No   Family History:  Family History  Problem Relation Age of Onset   Hypertension Mother    COPD Mother    COPD Maternal Grandmother    Anxiety disorder Maternal Grandmother    Sleep apnea Maternal Grandmother    COPD Maternal Grandfather    Anxiety disorder Paternal Grandmother    Asthma Paternal Grandmother    Depression Paternal Grandmother     Review of Systems: Constitutional: Doesn't report fevers, chills or abnormal weight loss Eyes: Doesn't report blurriness of vision Ears, nose, mouth, throat, and face: Doesn't report sore throat Respiratory: Doesn't report cough, dyspnea or wheezes Cardiovascular: Doesn't report palpitation, chest discomfort  Gastrointestinal:  Doesn't report nausea, constipation, diarrhea GU: Doesn't report incontinence Skin: Doesn't report skin rashes Neurological: Per HPI Musculoskeletal: Doesn't  report joint pain Behavioral/Psych: Doesn't report anxiety  Physical Exam: Vitals:   03/19/23 1017  BP: (!) 138/101  Pulse: 97  Resp: 20  Temp: 98.6 F (37 C)  SpO2: 100%   KPS: 90. General: Alert, cooperative, pleasant, in no acute distress Head: Normal EENT: No conjunctival injection or scleral icterus.  Lungs: Resp effort normal Cardiac: Regular rate Abdomen: Non-distended abdomen Skin: No rashes cyanosis or petechiae. Extremities: No clubbing or edema  Neurologic Exam: Mental Status: Awake, alert, attentive to examiner. Oriented to self and environment. Language is fluent with intact comprehension.  Cranial Nerves: Visual acuity is grossly normal. Visual fields are full. Extra-ocular movements intact. No ptosis. Face is symmetric Motor: Tone and bulk are normal. Power is full in both arms and legs. Reflexes are symmetric, no pathologic reflexes present.  Sensory: Intact to light touch Gait: Normal.   Labs: I have reviewed the data as listed    Component Value Date/Time   NA 138 02/09/2023 1054   K 4.0 02/09/2023 1054  CL 103 02/09/2023 1054   CO2 26 02/09/2023 1054   GLUCOSE 98 02/09/2023 1054   BUN 17 02/09/2023 1054   CREATININE 0.99 02/09/2023 1054   CALCIUM 9.6 02/09/2023 1054   PROT 7.4 02/09/2023 1054   ALBUMIN 4.4 02/09/2023 1054   AST 14 (L) 02/09/2023 1054   ALT 22 02/09/2023 1054   ALKPHOS 72 02/09/2023 1054   BILITOT 0.4 02/09/2023 1054   GFRNONAA >60 02/09/2023 1054   GFRAA NOT CALCULATED 01/02/2018 2348   Lab Results  Component Value Date   WBC 10.8 (H) 01/29/2023   NEUTROABS 13.6 (H) 01/27/2023   HGB 10.0 (L) 01/29/2023   HCT 30.2 (L) 01/29/2023   MCV 89.9 01/29/2023   PLT 174 01/29/2023    Imaging: CLINICAL DATA:  Seizure-like activity.   EXAM: MRI HEAD WITHOUT AND WITH CONTRAST   TECHNIQUE: Multiplanar, multiecho pulse sequences of the brain and surrounding structures were obtained without and with intravenous contrast.    CONTRAST:  5 mL Vueway   COMPARISON:  Head CT 09/25/2022 and MRI 01/14/2022   FINDINGS: Brain: A cystic focus in the anterior right temporal lobe and surrounding FLAIR hyperintensity are stable to minimally larger than on the prior MRI, with this region of signal abnormality in total measuring up to 2.6 cm in maximal dimension. FLAIR signal abnormality involves the overlying cortex. There is no associated enhancement. There is mild regional mass effect.   The remainder of the brain is normal in signal. No acute infarct, intracranial hemorrhage, midline shift, or extra-axial fluid collection is evident. The ventricles are normal in size. A small developmental venous anomaly is noted in the left frontal lobe.   Vascular: Major intracranial vascular flow voids are preserved.   Skull and upper cervical spine: Unremarkable bone marrow signal.   Sinuses/Orbits: Unremarkable orbits. Paranasal sinuses and mastoid air cells are clear.   Other: None.   IMPRESSION: Stable to minimally increased size of a nonenhancing cystic lesion and surrounding FLAIR signal abnormality in the right temporal lobe. This remains suspicious for a low-grade neoplasm.     Electronically Signed   By: Sebastian Ache M.D.   On: 12/31/2022 16:10    Pathology: Case Report Surgical Pathology Report                         Case: [id number]                                 Authorizing Provider:  Venetia Night         Collected:           01/25/2023                 Ordering Location:     [facility] Surgical Pathology and Received:            01/29/2023 1407                                    Cytopathology  Pathologist:           Stephanie Acre, MD                                                         Specimen:    Brain, [phone]                                                                      Addendum   The case was sent to [facility] for methylation  profiling. The composite methylation profile, using versions 11b6 and 12b6 of the [place] classifier, as well as versions 2.0 and 3.0 of the NCI-[employer] classifier, indicates a consensus match to Angiocentric glioma. Dimensionality reduction with UMAP and t-SNE also places this tumor in the same diagnostic class. A copy of the [facility] report [phone]) is scanned and attached to this report. The final integrated diagnosis is as follows:   A.  Outside consult, [phone], [facility], [place], [place].  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  Outside consult, [phone], [facility], [place], [place].  Date of procedure 01/25/23:    Low grade glial/glioneuronal neoplasm.   Comment: The lesion is difficult to classify based on histomorphology, but is overall consistent with a low grade glial or glioneuronal neoplasm. Chromosomal microarray showed an interstitial deletion within 6q, flanked by MYB and QKI, which may result in a MYB::QKI fusion; however no oncogenic fusions were detected on NGS fusion panel. The differential diagnosis includes an angiocentric glioma, MYB-altered diffuse astrocytoma, or others. Methylation profiling is pending for final categorization of this neoplasm. An integrated diagnosis will be reported in an addendum when methylation results are available.    Electronically signed by Stephanie Acre, MD on 02/23/2023     Assessment/Plan Angiocentric glioma (HCC)  Seizures [facility])  We appreciate the opportunity to participate in the care of Diane Kelly.   Diane Kelly presents with clinical, radiographic, histologic syndrome consistent with right temporal angiocentric glioma, a WHO 1 glioneuronal tumor.   Surgical  resection is considered gross total based on op reprt and post-op CT head.  We reviewed pathology, treatment recommendations for this rare tumor based on review of literature, and comparison to similar low grade glioneuronal tumors.  We will recommend imaging surveillance only, at this time.  Diane Kelly is agreeable with this.  Diane Kelly is experiencing some mood effects from Keppra; seizure medications will be managed by Dr. Daisy Blossom team.  Screening for potential clinical trials was performed and discussed using eligibility criteria for active protocols at [facility], loco-regional tertiary centers, as well as national database available on GroundTransfer.at.    The patient is not a candidate for a research protocol at this time due to no suitable study identified.   We spent twenty additional minutes teaching regarding the natural history, biology, and historical experience in the treatment of  brain tumors. We then discussed in detail the current recommendations for therapy focusing on the mode of administration, mechanism of action, anticipated toxicities, and quality of life issues associated with this plan. We also provided teaching sheets for the patient to take home as an additional resource.  All questions were answered. The patient knows to call the clinic with any problems, questions or concerns. No barriers to learning were detected.  The total time spent in the encounter was 60 minutes and more than 50% was on counseling and review of test results   Henreitta Leber, MD Medical Director of Neuro-Oncology [facility] 03/19/23 10:15 AM

## 2023-04-15 ENCOUNTER — Ambulatory Visit (INDEPENDENT_AMBULATORY_CARE_PROVIDER_SITE_OTHER): Payer: Medicaid Other | Admitting: Neurosurgery

## 2023-04-15 DIAGNOSIS — D496 Neoplasm of unspecified behavior of brain: Secondary | ICD-10-CM

## 2023-04-15 DIAGNOSIS — Z09 Encounter for follow-up examination after completed treatment for conditions other than malignant neoplasm: Secondary | ICD-10-CM

## 2023-04-15 NOTE — Progress Notes (Signed)
   Neurosurgery Telephone (Audio-Only) Note  Requesting Provider     No referring provider defined for this encounter. T: N/A F:   Primary Care Provider Alm Bustard, NP [address] [place] [address] T: [phone] F: [phone]  Telehealth visit was conducted with Diane Kelly, a 23 y.o. female via telephone.   History of Present Illness: Diane Kelly is a 23 y.o presenting today via telephone visit for 2.5 month f/u after craniotomy for tumor resection. She is largely doing well. Aside from some intermittent headache her only concern today was how she is supposed to be taking her AEDs. From her description she is supposed to be working to come off of Keppra due to side effects.   03/11/23 DOS: 01/25/23 right craniotomy for tumor resection Final path pending consultation with [facility]  HISTORY OF PRESENT ILLNESS: Diane Kelly is status post craniotomy for tumor resection.     She is doing very well.  She had a postoperative seizure on postoperative day 2, but has had no significant seizures since then.  She used to have auras very frequently.  She has not had one for quite some time.    General Review of Systems:  A ROS was performed including pertinent positive and negatives as documented.  All other systems are negative.   Prior to Admission medications   Medication Sig Start Date End Date Taking? Authorizing Provider  ibuprofen (ADVIL) 200 MG tablet Take 800 mg by mouth every 6 (six) hours as needed for moderate pain (pain score 4-6).    [provider]  lamoTRIgine (LAMICTAL) 150 MG tablet Take 150 mg by mouth 2 (two) times daily. 01/18/23   [provider]  levETIRAcetam (KEPPRA) 500 MG tablet Take 1 tablet (500 mg total) by mouth 2 (two) times daily. 01/29/23   Tim Lair, PA-C     DATA REVIEWED     IMPRESSION  Diane Kelly is a 23 y.o. female who I performed a telephone encounter today for follow up after  resection tumor resection  PLAN  Ms. Vanmeter is doing well from a neurosurgical standpoint. I will defer her questions regarding her AEDs to Neurology. I have sent a message to Dr. Malvin Johns and Dr. Barbaraann Cao on her behalf as she is unsure who is managing these. We will see her going forward on an as needed basis. She expressed understanding and was in agreement with this plan   No orders of the defined types were placed in this encounter.   DISPOSITION  Follow up: In person appointment in  PRN Susanne Borders, PA   TELEPHONE DOCUMENTATION  This visit was performed via telephone.  I spent a total of 5 minutes non-face-to-face activities for this visit on the date of this encounter including review of current clinical condition and response to treatment.  The patient is aware of and accepts the limits of this telehealth visit.

## 2023-07-06 ENCOUNTER — Encounter: Payer: Self-pay | Admitting: Family Medicine

## 2023-07-06 ENCOUNTER — Encounter: Payer: Self-pay | Admitting: Neurosurgery

## 2023-07-07 ENCOUNTER — Encounter: Payer: Self-pay | Admitting: Neurosurgery

## 2023-07-23 ENCOUNTER — Ambulatory Visit
Admission: RE | Admit: 2023-07-23 | Discharge: 2023-07-23 | Disposition: A | Discharge status: Home / Self Care | Payer: Medicaid Other | Source: Ambulatory Visit | Attending: Internal Medicine | Admitting: Internal Medicine

## 2023-07-23 DIAGNOSIS — C719 Malignant neoplasm of brain, unspecified: Secondary | ICD-10-CM | POA: Insufficient documentation

## 2023-07-23 MED ORDER — GADOBUTROL 1 MMOL/ML IV SOLN
5.0000 mL | Freq: Once | INTRAVENOUS | Status: AC | PRN
  Administered 2023-07-23: 5 mL via INTRAVENOUS

## 2023-07-30 ENCOUNTER — Encounter: Payer: Self-pay | Admitting: *Deleted

## 2023-07-30 ENCOUNTER — Inpatient Hospital Stay: Payer: Medicaid Other | Attending: Internal Medicine | Admitting: Internal Medicine

## 2023-07-30 VITALS — BP 123/72 | HR 81 | Temp 96.4°F | Resp 14 | Wt 103.0 lb

## 2023-07-30 DIAGNOSIS — F17210 Nicotine dependence, cigarettes, uncomplicated: Secondary | ICD-10-CM | POA: Diagnosis not present

## 2023-07-30 DIAGNOSIS — R569 Unspecified convulsions: Secondary | ICD-10-CM | POA: Diagnosis not present

## 2023-07-30 DIAGNOSIS — C719 Malignant neoplasm of brain, unspecified: Secondary | ICD-10-CM | POA: Insufficient documentation

## 2023-07-30 DIAGNOSIS — Z79899 Other long term (current) drug therapy: Secondary | ICD-10-CM | POA: Diagnosis not present

## 2023-07-30 NOTE — Progress Notes (Signed)
 Pt in for follow up, reports she is still taking keppra  once a day at bedtime.  Pt also taking lamotrigine  twice a day. Reports nauseous a lot and not able to eat well. Pt also reports difficulty with short and long term memory.

## 2023-07-30 NOTE — Progress Notes (Signed)
 [facility] at [facility] [address]  [place], [place] [address] [phone]   Interval Evaluation  Date of Service: 07/30/23 Patient Name: Diane Kelly Patient MRN: [id number] Patient DOB: 11-May-2000 Provider: Mamie Searles, MD  Identifying Statement:  Diane Kelly is a 23 y.o. 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 Jeris Montes.  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  No new or progressive changes today.  Denies further seizures.  Continues on Lamictal  200mg  twice per day, Keppra  was discontinued due to mood effects.    H+P (03/19/23) Patient presented to neurologic attention initially at age 46, with new onset seizures.  These were/are described as "eyes rolling back, disconnected for several minutes, followed by brief confusion".  In 2023, MRI brain demonstrated a lesion with the right temporal lobe suspicious for primary brain tumor.  Repeat scan in 2024 demonstrated slight growth.  She was referred to neurosurgery, underwent resection with Dr. Jeris Montes on 01/27/23 without complication.  There was a post-surgical bout of status epilepticus, but she has been seizure free since that time.  Currently dosing lamictal  150mg  twice per day and Keppra  500mg  twice per day.  Medications: Current Outpatient Medications on File Prior to Visit  Medication Sig Dispense Refill   ibuprofen (ADVIL) 200 MG tablet Take 800 mg by mouth every 6 (six) hours as needed for moderate pain (pain score 4-6).     lamoTRIgine  (LAMICTAL ) 150 MG tablet Take 150 mg by mouth 2 (two) times daily.     levETIRAcetam  (KEPPRA ) 500 MG tablet Take 1 tablet (500 mg total) by mouth 2 (two) times  daily. 60 tablet 2   No current facility-administered medications on file prior to visit.    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)    Anemia    Anxiety    Brain tumor (HCC)    Cannabis abuse    Deliberate self-cutting    Depression    DMDD (disruptive mood dysregulation disorder) (HCC)    Epilepsy (HCC)    last seizure 12-10-22   Heart murmur    asymptomatic   Nicotine  abuse    Right temporal lobe mass 01/14/2022   Vapes nicotine  containing substance    Past Surgical History:  Past Surgical History:  Procedure Laterality Date   APPLICATION OF CRANIAL NAVIGATION N/A 01/25/2023   Procedure: APPLICATION OF CRANIAL NAVIGATION;  Surgeon: Jodeen Munch, MD;  Location: [facility] ORS;  Service: Neurosurgery;  Laterality: N/A;   CRANIOTOMY Right 01/25/2023   Procedure: RIGHT CRANIOTOMY FOR TUMOR RESECTION;  Surgeon: Jodeen Munch, MD;  Location: [facility] ORS;  Service: Neurosurgery;  Laterality: Right;   NO PAST SURGERIES     Social History:  Social History   Socioeconomic History   Marital status: Single    Spouse name: Not on file   Number of children: Not on file   Years of education: Not on file   Highest education level: Not on file  Occupational History   Not on file  Tobacco Use   Smoking status: Some Days    Current packs/day: 0.50    Types: Cigarettes   Smokeless tobacco: Current  Vaping Use   Vaping status: Every Day   Substances: Nicotine , Flavoring  Substance and Sexual Activity   Alcohol use: Yes    Comment: occ   Drug use: Not Currently    Comment: cbd   Sexual activity: Yes    Birth control/protection: Condom  Other Topics Concern   Not on file  Social History Narrative   Not on file   Social Drivers of Health   Financial Resource Strain: Medium Risk (07/02/2023)   Received from [facility]   Overall Financial Resource  Strain (CARDIA)    Difficulty of Paying Living Expenses: Somewhat hard  Food Insecurity: No Food Insecurity (07/02/2023)   Received from [facility]   Hunger Vital Sign    Worried About Running Out of Food in the Last Year: Never true    Ran Out of Food in the Last Year: Never true  Transportation Needs: No Transportation Needs (07/02/2023)   Received from [facility] - Transportation    In the past 12 months, has lack of transportation kept you from medical appointments or from getting medications?: No    Lack of Transportation (Non-Medical): No  Physical Activity: Not on file  Stress: Not on file  Social Connections: Unknown (07/07/2022)   Received from [facility], [facility] Short Social Needs Screening - Social Connection    Would you like help with any of the following needs: food, medicine/medical supplies, transportation, loneliness, housing or utilities?: Not on file  Intimate Partner Violence: Not At Risk (01/29/2023)   Humiliation, Afraid, Rape, and Kick questionnaire    Fear of Current or Ex-Partner: No    Emotionally Abused: No    Physically Abused: No    Sexually Abused: No   Family History:  Family History  Problem Relation Age of Onset   Hypertension Mother    COPD Mother    COPD Maternal Grandmother    Anxiety disorder Maternal Grandmother    Sleep apnea Maternal Grandmother    COPD Maternal Grandfather    Anxiety disorder Paternal Grandmother    Asthma Paternal Grandmother    Depression Paternal Grandmother     Review of Systems: Constitutional: Doesn't report fevers, chills or abnormal weight loss Eyes: Doesn't report blurriness of vision Ears, nose, mouth, throat, and face: Doesn't report sore throat Respiratory: Doesn't report cough, dyspnea or wheezes Cardiovascular: Doesn't report palpitation, chest discomfort  Gastrointestinal:  Doesn't report nausea, constipation, diarrhea GU: Doesn't report  incontinence Skin: Doesn't report skin rashes Neurological: Per HPI Musculoskeletal: Doesn't report joint pain Behavioral/Psych: Doesn't report anxiety  Physical Exam: There were no vitals filed for this visit.  KPS: 90. General: Alert, cooperative, pleasant, in no acute distress Head: Normal EENT: No conjunctival injection or scleral icterus.  Lungs: Resp effort normal Cardiac: Regular rate Abdomen: Non-distended abdomen Skin: No rashes cyanosis or petechiae. Extremities: No clubbing or edema  Neurologic Exam: Mental Status: Awake, alert, attentive to examiner. Oriented to self and environment. Language is fluent with intact comprehension.  Cranial Nerves: Visual acuity is grossly normal. Visual fields are full. Extra-ocular movements intact. No ptosis. Face is symmetric Motor: Tone and bulk are normal. Power is full in both arms and legs. Reflexes are symmetric, no pathologic reflexes present.  Sensory: Intact to light touch Gait: Normal.   Labs: I have reviewed the data as listed    Component Value Date/Time   NA 138 02/09/2023 1054   K 4.0 02/09/2023 1054  CL 103 02/09/2023 1054   CO2 26 02/09/2023 1054   GLUCOSE 98 02/09/2023 1054   BUN 17 02/09/2023 1054   CREATININE 0.99 02/09/2023 1054   CALCIUM 9.6 02/09/2023 1054   PROT 7.4 02/09/2023 1054   ALBUMIN 4.4 02/09/2023 1054   AST 14 (L) 02/09/2023 1054   ALT 22 02/09/2023 1054   ALKPHOS 72 02/09/2023 1054   BILITOT 0.4 02/09/2023 1054   GFRNONAA >60 02/09/2023 1054   GFRAA NOT CALCULATED 01/02/2018 2348   Lab Results  Component Value Date   WBC 10.8 (H) 01/29/2023   NEUTROABS 13.6 (H) 01/27/2023   HGB 10.0 (L) 01/29/2023   HCT 30.2 (L) 01/29/2023   MCV 89.9 01/29/2023   PLT 174 01/29/2023    Imaging:  [facility] Clinician Interpretation: I have personally reviewed the CNS images as listed.  My interpretation, in the context of the patient's clinical presentation, is stable disease  MR BRAIN W WO  CONTRAST Result Date: 07/23/2023 CLINICAL DATA:  Provided history: Brain/CNS neoplasm, assess treatment response. Angiocentric glioma. EXAM: MRI HEAD WITHOUT AND WITH CONTRAST TECHNIQUE: Multiplanar, multiecho pulse sequences of the brain and surrounding structures were obtained without and with intravenous contrast. CONTRAST:  5mL GADAVIST  GADOBUTROL  1 MMOL/ML IV SOLN COMPARISON:  Head CT 01/27/2023.  Brain MRI 12/07/2022. FINDINGS: Brain: Since the brain MRI of 12/07/2022, there has been right pterional craniotomy for resection of a right temporal lobe lesion. T2 FLAIR hyperintense signal abnormality surrounding the resection cavity measuring up to 12 mm in thickness, which may reflect post-operative gliosis and/or non-enhancing infiltrative tumor (for instance as seen on series 9, image 17). Small amount of curvilinear enhancement about the resection site, which is likely post-operative. No nodular enhancement at the resection site. There is no acute infarct. No extra-axial fluid collection. No midline shift. Vascular: Maintained flow voids within the proximal large arterial vessels. Skull and upper cervical spine: Right pterional cranioplasty. Sinuses/Orbits: No mass or acute finding within the imaged orbits. No significant paranasal sinus disease. IMPRESSION: Since the brain MRI of 12/07/2022, there has been interval right pterional craniotomy for resection of a right temporal lobe lesion. T2 FLAIR hyperintense signal abnormality surrounding the resection cavity (measuring up to 12 mm in thickness), which may reflect post-operative gliosis and/or non-enhancing infiltrative tumor. No nodular enhancement. Electronically Signed   By: Bascom Lily D.O.   On: 07/23/2023 12:44     Pathology: Case Report Surgical Pathology Report                         Case: [id number]                                 Authorizing Provider:  Jodeen Munch         Collected:           01/25/2023                 Ordering  Location:     [facility] Surgical Pathology and Received:            01/29/2023 1407                                    Cytopathology  Pathologist:           Evelynn Hirschfeld, MD                                                         Specimen:    Brain, [phone]                                                                      Addendum   The case was sent to [facility] for methylation profiling. The composite methylation profile, using versions 11b6 and 12b6 of the York classifier, as well as versions 2.0 and 3.0 of the NCI-Bethesda classifier, indicates a consensus match to Angiocentric glioma. Dimensionality reduction with UMAP and t-SNE also places this tumor in the same diagnostic class. A copy of the [facility] report [id number]) is scanned and attached to this report. The final integrated diagnosis is as follows:   A.  Outside consult, [phone], [facility], [place], [place].  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  Outside consult, [phone], [facility], [place], [place].  Date of procedure 01/25/23:    Low grade glial/glioneuronal neoplasm.   Comment: The lesion is difficult to classify based on histomorphology, but is overall consistent with a low grade glial or glioneuronal neoplasm. Chromosomal microarray showed an interstitial deletion within 6q, flanked by MYB and QKI, which may result in a MYB::QKI fusion; however no oncogenic fusions were detected on NGS fusion panel. The differential diagnosis includes an angiocentric glioma, MYB-altered diffuse astrocytoma, or others. Methylation profiling is pending for final categorization of this neoplasm. An integrated diagnosis will be  reported in an addendum when methylation results are available.    Electronically signed by Evelynn Hirschfeld, MD on 02/23/2023     Assessment/Plan Angiocentric glioma (HCC)  Seizures (HCC)  Diane Kelly is clinically stable today.  MRI brain demonstrates expected post-operative findings, with some T2/FLAIR signal of non-specific etiology.  We reviewed pathology, treatment recommendations for this rare tumor based on review of literature, and comparison to similar low grade glioneuronal tumors.  We will recommend imaging surveillance only, at this time.  She is agreeable with this.  To continue Lamictal  200mg  BID for seizure prevention.  We ask that Diane Kelly return to clinic in 6 months following next brain MRI, or sooner as needed.  All questions were answered. The patient knows to call the clinic with any problems, questions or concerns. No barriers to learning were detected.  The total time spent in the encounter was 40 minutes and more than 50% was on counseling and review of test results   Mamie Searles, MD Medical Director of Neuro-Oncology [facility] 07/30/23 10:20 AM

## 2023-08-05 ENCOUNTER — Inpatient Hospital Stay

## 2023-08-05 NOTE — Progress Notes (Signed)
 Nutrition  Called patient for scheduled telephone nutrition appointment but no answer.  Left message with call back number.  Will ask scheduling team to offer another appointment.  Jancy Sprankle B. Zollie Hipp, CSO, LDN Registered Dietitian [phone]

## 2023-08-12 ENCOUNTER — Other Ambulatory Visit: Payer: Self-pay

## 2023-08-12 ENCOUNTER — Emergency Department: Admission: EM | Admit: 2023-08-12 | Discharge: 2023-08-12 | Disposition: A | Discharge status: Home / Self Care

## 2023-08-12 DIAGNOSIS — R569 Unspecified convulsions: Secondary | ICD-10-CM | POA: Insufficient documentation

## 2023-08-12 DIAGNOSIS — D72829 Elevated white blood cell count, unspecified: Secondary | ICD-10-CM | POA: Diagnosis not present

## 2023-08-12 LAB — BASIC METABOLIC PANEL WITH GFR
Anion gap: 14 (ref 5–15)
BUN: 15 mg/dL (ref 6–20)
CO2: 18 mmol/L — ABNORMAL LOW (ref 22–32)
Calcium: 9.1 mg/dL (ref 8.9–10.3)
Chloride: 104 mmol/L (ref 98–111)
Creatinine, Ser: 0.8 mg/dL (ref 0.44–1.00)
GFR, Estimated: >60 mL/min (ref >60)
Glucose, Bld: 131 mg/dL — ABNORMAL HIGH (ref 70–99)
Potassium: 3.6 mmol/L (ref 3.5–5.1)
Sodium: 136 mmol/L (ref 135–145)

## 2023-08-12 LAB — CBC
HCT: 36.5 % (ref 36.0–46.0)
Hemoglobin: 12.1 g/dL (ref 12.0–15.0)
MCH: 28.9 pg (ref 26.0–34.0)
MCHC: 33.2 g/dL (ref 30.0–36.0)
MCV: 87.3 fL (ref 80.0–100.0)
Platelets: 243 10*3/uL (ref 150–400)
RBC: 4.18 MIL/uL (ref 3.87–5.11)
RDW: 12.6 % (ref 11.5–15.5)
WBC: 16.7 10*3/uL — ABNORMAL HIGH (ref 4.0–10.5)
nRBC: 0 % (ref 0.0–0.2)

## 2023-08-12 LAB — HCG, QUANTITATIVE, PREGNANCY: hCG, Beta Chain, Quant, S: 1 m[IU]/mL (ref <5)

## 2023-08-12 NOTE — Discharge Instructions (Signed)
 Your evaluation in the emergency department was overall reassuring.  Continue to take your lamotrigine  as already prescribed, and I do recommend you call your neurologist tomorrow morning to discuss whether or not you should be restarted on the Keppra .  I also recommend you decrease your marijuana use, as this could be lowering your seizure threshold.  Continue not to drive.  Return to the emergency department any new or worsening symptoms.

## 2023-08-12 NOTE — ED Triage Notes (Signed)
 Pt states she had seizure today, pt states its been about 6 months ago she has had one, pt states she takes Lamictal  and hasn't missed any doses. Pt denies any illness. Pt denies ETOH or drug use but endorses she does vape.  NAD noted. Pt is A&Ox4.

## 2023-08-12 NOTE — ED Provider Notes (Signed)
 [facility] Provider Note    Event Date/Time   First MD Initiated Contact with Patient 08/12/23 1805     (approximate)   History   Seizures  Pt states she had seizure today, pt states its been about 6 months ago she has had one, pt states she takes Lamictal  and hasn't missed any doses. Pt denies any illness. Pt denies ETOH or drug use but endorses she does vape.  NAD noted. Pt is A&Ox4.    HPI Diane Kelly is a 23 y.o. female PMH seizure disorder, angiocentric glioma status post resection by neurosurgery (01/27/2023) presents for evaluation after seizure - Patient had a witnessed seizure while at work today.  Reportedly generalized tonic-clonic, lasted about 2 minutes. -No preceding trauma.  Did have prodrome prior to event.  Last seizure was about 6 months ago. -Is taking Lamictal  though currently has been weaned off of Keppra . -Did use marijuana today, denies any other recent drug use -No recent infectious symptoms     Physical Exam   Triage Vital Signs: ED Triage Vitals [08/12/23 1606]  Encounter Vitals Group     BP 120/72     Girls Systolic BP Percentile      Girls Diastolic BP Percentile      Boys Systolic BP Percentile      Boys Diastolic BP Percentile      Pulse Rate (!) 112     Resp 17     Temp 98.4 F (36.9 C)     Temp Source Oral     SpO2 95 %     Weight 101 lb 6.6 oz (46 kg)     Height 5' 2 (1.575 m)     Head Circumference      Peak Flow      Pain Score      Pain Loc      Pain Education      Exclude from Growth Chart     Most recent vital signs: Vitals:   08/12/23 1606  BP: 120/72  Pulse: (!) 112  Resp: 17  Temp: 98.4 F (36.9 C)  SpO2: 95%     General: Awake, no distress.  HEENT: normocephalic, atraumatic CV:  Good peripheral perfusion. RRR, RP 2+ Resp:  Normal effort. CTAB Abd:  No distention. Nontender to deep palpation throughout Neuro:  Aox4, CN II-XII intact, FNF wnl, finger taps fast b/l, 5/5 strength  in bilateral finger extension/grip, arm flexion/extension, EHL/FHL. BUE AG 10+ sec no drift, BLE AG 5+ sec no drift. Ambulates with steady gait. SILT. Negative Rhomberg.    ED Results / Procedures / Treatments   Labs (all labs ordered are listed, but only abnormal results are displayed) Labs Reviewed  BASIC METABOLIC PANEL WITH GFR - Abnormal; Notable for the following components:      Result Value   CO2 18 (*)    Glucose, Bld 131 (*)    All other components within normal limits  CBC - Abnormal; Notable for the following components:   WBC 16.7 (*)    All other components within normal limits  HCG, QUANTITATIVE, PREGNANCY  LAMOTRIGINE  LEVEL  POC URINE PREG, ED     EKG  N/a   RADIOLOGY N/a    PROCEDURES:  Critical Care performed: No  Procedures   MEDICATIONS ORDERED IN ED: Medications - No data to display   IMPRESSION / MDM / ASSESSMENT AND PLAN / ED COURSE  I reviewed the triage vital signs and the nursing notes.  DDX/MDM/AP: Differential diagnosis includes, but is not limited to, breakthrough seizure in this patient with a known seizure disorder, consider underlying electrolyte abnormality, no history or findings to suggest underlying infection.  Consider marijuana possibly lowering seizure threshold.  Do suspect recent discontinuation of Keppra  likely contributing to presentation today as well.  Do not suspect acute intracranial pathology at this time.  No evidence of status epilepticus, has returned to baseline mental state.  Labs performed at triage with leukocytosis, suspect stress response.  No significant electrolyte abnormalities on BMP.  HCG neg.   Plan: - Discussed reinitiation of Keppra  with patient though she would like to discuss this with her neurologist first, plans to call neurologist tomorrow morning.  Apparently had mood swings side effects from Keppra  previously.  Will plan to continue Lamictal  in the meantime.  Plan  to continue not to drive.  ED return precautions in place.  Patient and husband agree with plan.  Patient's presentation is most consistent with acute complicated illness / injury requiring diagnostic workup.  ED course below.       FINAL CLINICAL IMPRESSION(S) / ED DIAGNOSES   Final diagnoses:  Seizure (HCC)     Rx / DC Orders   ED Discharge Orders     None        Note:  This document was prepared using Dragon voice recognition software and may include unintentional dictation errors.   Collis Deaner, MD 08/12/23 [phone]

## 2023-08-13 LAB — LAMOTRIGINE LEVEL: Lamotrigine Lvl: 7.3 ug/mL (ref 2.0–20.0)

## 2023-09-13 NOTE — Progress Notes (Signed)
 History of Present Illness:   Diane Kelly is a 23 y.o. female here for   Verbally consented to the use of AI for note-taking.   Chief Complaint  Patient presents with  . Amenorrhea    Start date of last menstrual 5/15. Sharp pains right lower abdomen for 1 week. Started bleeding this morning.     History of Present Illness Diane Kelly is a 23 year old female who presents with abdominal pain and irregular menstrual bleeding.  She experiences abdominal pain localized to the lower abdomen and has started vaginal bleeding this morning. Her last menstrual period was on May 15, making her current cycle overdue by a month and two weeks.  She has a history of seizures, with the most recent episodes occurring on June 19, when she had two seizures, one at home and another at work. She had been seizure-free for six months prior to this. She is currently taking Lamictal  without missing any doses. She was prescribed Effexor but has not started it yet.  She had a urinary tract infection diagnosed previously, for which Cipro was prescribed on May 14. However, there is uncertainty about whether she completed the antibiotic course. Her urine had a concentrated smell and was darker in color recently, but she denies any burning sensation during urination.  In the emergency room visit in June, her white blood cell count was elevated at 16.7, compared to a normal count of 7.5 in May. A pregnancy test conducted during the emergency room visit was negative. She has taken multiple pregnancy tests, all of which were negative.   Past Medical History:   Past Medical History:  Diagnosis Date  . Alcohol abuse   . Alcohol intoxication with mild use disorder () 01/05/2018  . Anemia   . Angiocentric glioma of brain (CMS/HHS-HCC)   . Anxiety   . Brain tumor (benign) (CMS/HHS-HCC)   . Cannabis abuse   . Deliberate self-cutting   . Depression   . DMDD (disruptive mood dysregulation disorder) (CMS/HHS-HCC)    . Heart murmur   . Nicotine  abuse   . 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 Seizures (CMS/HHS-HCC)     Past Surgical History:   Past Surgical History:  Procedure Laterality Date  . APPLICATION OF CRANIAL NAVIGATION   01/25/2023   Surgeon: Clois Fret, MD; Location: [facility] ORS; Service: Neurosurgery; Laterality: N/A  . RIGHT CRANIOTOMY FOR TUMOR RESECTION Right 01/25/2023   Surgeon: Clois Fret, MD; Location: [facility] ORS; Service: Neurosurgery; Laterality: Right    Allergies:  No Known Allergies  Current Medications:   Prior to Admission medications  Medication Sig Taking? Last Dose  lamoTRIgine  (LAMICTAL ) 200 MG tablet Take 1 tablet (200 mg total) by mouth every 12 (twelve) hours Yes Taking  lamoTRIgine  (LAMICTAL ) 200 MG tablet Take 200 mg in the morning, and  200mg  +50 mg tablet at night for 1 week, then increase to 250 mg twice a day Yes Taking  lamoTRIgine  (LAMICTAL ) 25 MG tablet Take 50 mg, along with 200 mg tabs to  equal 250 mg twice a day. Yes Taking  levETIRAcetam  (KEPPRA ) 250 MG tablet TAKE 1 TABLET BY MOUTH EVERY 12 HOURS. Patient not taking: Reported on 09/13/2023  Not Taking  venlafaxine (EFFEXOR-XR) 75 MG XR capsule Take 1 capsule (75 mg total) by mouth at bedtime Patient not taking: Reported on 09/13/2023  Not Taking    Family History:   Family History  Problem Relation Name Age of Onset  . Alcohol abuse Mother Clotilda and  westley   . Anxiety Mother Clotilda and angelita   . High blood pressure (Hypertension) Mother Clotilda and angelita   . COPD Mother Clotilda and angelita   . Bipolar disorder Father Darryle   . High blood pressure (Hypertension) Maternal Uncle Christopher   . Osteoporosis (Thinning of bones) Paternal Wylie Dux   . Breast cancer Maternal Grandmother Merlynn   . COPD Maternal Grandmother Merlynn   . Anxiety Maternal Grandmother Merlynn   . Depression Paternal Grandmother    . Asthma Paternal Grandmother    . Anxiety Paternal Grandmother       Social History:   Social History   Socioeconomic History  . Marital status: Single  Tobacco Use  . Smoking status: Former    Current packs/day: 0.25    Types: Cigarettes    Passive exposure: Never  . Smokeless tobacco: Never  Vaping Use  . Vaping status: Every Day  Substance and Sexual Activity  . Alcohol use: Not Currently    Comment: says that she has quit  . Drug use: Yes    Comment: marijuana sometimes, THC  . Sexual activity: Yes    Partners: Female, Female    Birth control/protection: None   Social Drivers of Health   Financial Resource Strain: Medium Risk (09/13/2023)   Overall Financial Resource Strain (CARDIA)   . Difficulty of Paying Living Expenses: Somewhat hard  Food Insecurity: No Food Insecurity (09/13/2023)   Hunger Vital Sign   . Worried About Programme researcher, broadcasting/film/video in the Last Year: Never true   . Ran Out of Food in the Last Year: Never true  Transportation Needs: No Transportation Needs (09/13/2023)   PRAPARE - Transportation   . Lack of Transportation (Medical): No   . Lack of Transportation (Non-Medical): No  Housing Stability: Low Risk  (09/13/2023)   Housing Stability Vital Sign   . Unable to Pay for Housing in the Last Year: No   . Number of Times Moved in the Last Year: 0   . Homeless in the Last Year: No    Review of Systems:   A 10 point review of systems is negative, except for the pertinent positives and negatives detailed in the HPI.  Vitals:   Vitals:   09/13/23 0930  BP: 102/64  Pulse: 92  SpO2: 99%  Weight: 46.8 kg (103 lb 3.2 oz)  Height: 157.5 cm (5' 2)     Body mass index is 18.88 kg/m.  Physical Exam:   Physical Exam Vitals and nursing note reviewed.  Constitutional:      General: She is not in acute distress.    Appearance: Normal appearance. She is not ill-appearing, toxic-appearing or diaphoretic.  HENT:     Head: Normocephalic and atraumatic.     Right Ear: External ear normal.     Left Ear: External ear  normal.  Eyes:     General:        Right eye: No discharge.        Left eye: No discharge.     Conjunctiva/sclera: Conjunctivae normal.  Cardiovascular:     Rate and Rhythm: Normal rate and regular rhythm.     Pulses: Normal pulses.     Heart sounds: Normal heart sounds. No murmur heard.    No friction rub. No gallop.  Pulmonary:     Effort: Pulmonary effort is normal. No respiratory distress.     Breath sounds: Normal breath sounds. No stridor. No wheezing, rhonchi or rales.  Chest:  Chest wall: No tenderness.  Abdominal:     General: There is no distension.     Tenderness: There is abdominal tenderness in the suprapubic area. There is no guarding or rebound.   Skin:    General: Skin is warm and dry.     Capillary Refill: Capillary refill takes less than 2 seconds.  Neurological:     Mental Status: She is alert.     Assessment and Plan:  No results found for this visit on 09/13/23.  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 Her last menstrual period was on May 15, with no period in June, resulting in a delay of one month and two weeks. A pregnancy test in the emergency room last month was negative, but a repeat test is warranted due to the extended delay in her menstrual cycle. -  Order pregnancy test - Order blood count - Order urinalysis - Order kidney function test  Seizure disorder She experienced two seizures on June 19, one at home and one at work, after being seizure-free for six months. She is currently on Lamotrigine  (Lamictal ) and Levetiracetam  (Keppra ) for seizure management. Her Keppra  levels were low in May but normalized in June. Recent stress and seizures may have contributed to the disruption in her menstrual cycle. - Continue Lamotrigine  - Continue Levetiracetam   - Recheck Lamictal  levels today  Urinary tract infection (UTI) Previously diagnosed with a urinary tract infection, it is unclear if she completed the prescribed Ciprofloxacin (Cipro). She reports recent symptoms of dark, foul-smelling urine, indicating a possible persistent or recurrent UTI. Her white blood cell count was elevated at 16.7 in the emergency room, suggesting an infection. A urinalysis and culture are necessary to confirm the presence of a UTI and guide appropriate antibiotic therapy. - Order urinalysis and urine culture - If urinalysis is positive, prescribe antibiotic  General Health Maintenance Emphasized the importance of completing prescribed medications and following up on lab results to ensure overall health maintenance. Discussed the potential impact of untreated infections on menstrual cycles and overall health. - Educate on the importance of medication adherence   Follow up She will call the office with any new or worsening symptoms  This note has been created using automated tools and reviewed for accuracy by provider.  Patient received an After Visit Summary    Attestation Statement:   I personally performed the service, non-incident to. (WP)   GLENDA MACARIO HADDOCK, NP

## 2023-09-16 ENCOUNTER — Other Ambulatory Visit: Payer: Self-pay

## 2023-09-16 ENCOUNTER — Encounter: Payer: Self-pay | Admitting: *Deleted

## 2023-09-16 DIAGNOSIS — Z3A01 Less than 8 weeks gestation of pregnancy: Secondary | ICD-10-CM | POA: Diagnosis not present

## 2023-09-16 DIAGNOSIS — O209 Hemorrhage in early pregnancy, unspecified: Secondary | ICD-10-CM | POA: Diagnosis present

## 2023-09-16 LAB — COMPREHENSIVE METABOLIC PANEL WITH GFR
ALT: 9 U/L (ref 0–44)
AST: 12 U/L — ABNORMAL LOW (ref 15–41)
Albumin: 4.1 g/dL (ref 3.5–5.0)
Alkaline Phosphatase: 73 U/L (ref 38–126)
Anion gap: 7 (ref 5–15)
BUN: 15 mg/dL (ref 6–20)
CO2: 26 mmol/L (ref 22–32)
Calcium: 9.3 mg/dL (ref 8.9–10.3)
Chloride: 106 mmol/L (ref 98–111)
Creatinine, Ser: 0.85 mg/dL (ref 0.44–1.00)
GFR, Estimated: >60 mL/min (ref >60)
Glucose, Bld: 94 mg/dL (ref 70–99)
Potassium: 3.9 mmol/L (ref 3.5–5.1)
Sodium: 139 mmol/L (ref 135–145)
Total Bilirubin: 0.5 mg/dL (ref 0.0–1.2)
Total Protein: 7 g/dL (ref 6.5–8.1)

## 2023-09-16 LAB — URINALYSIS, ROUTINE W REFLEX MICROSCOPIC
Bacteria, UA: NONE SEEN
Bilirubin Urine: NEGATIVE
Glucose, UA: NEGATIVE mg/dL
Ketones, ur: NEGATIVE mg/dL
Leukocytes,Ua: NEGATIVE
Nitrite: NEGATIVE
Protein, ur: NEGATIVE mg/dL
Specific Gravity, Urine: 1.017 (ref 1.005–1.030)
pH: 8 (ref 5.0–8.0)

## 2023-09-16 LAB — CBC
HCT: 36.5 % (ref 36.0–46.0)
Hemoglobin: 12.3 g/dL (ref 12.0–15.0)
MCH: 29.4 pg (ref 26.0–34.0)
MCHC: 33.7 g/dL (ref 30.0–36.0)
MCV: 87.1 fL (ref 80.0–100.0)
Platelets: 273 K/uL (ref 150–400)
RBC: 4.19 MIL/uL (ref 3.87–5.11)
RDW: 13.1 % (ref 11.5–15.5)
WBC: 7.4 K/uL (ref 4.0–10.5)
nRBC: 0 % (ref 0.0–0.2)

## 2023-09-16 LAB — POC URINE PREG, ED: Preg Test, Ur: NEGATIVE

## 2023-09-16 LAB — HCG, QUANTITATIVE, PREGNANCY: hCG, Beta Chain, Quant, S: 99 m[IU]/mL — ABNORMAL HIGH (ref <5)

## 2023-09-16 NOTE — Telephone Encounter (Signed)
 Lab scheduled for 7/25 @8 :45

## 2023-09-16 NOTE — ED Triage Notes (Signed)
 Pt ambulatory to triage.  Pt has vag bleeding for 4 days.  Pt reports positive blood test at [facility] this week.  Pt has abd cramping.  Denies urinary sx.  Pt alert.

## 2023-09-17 ENCOUNTER — Emergency Department

## 2023-09-17 ENCOUNTER — Emergency Department
Admission: EM | Admit: 2023-09-17 | Discharge: 2023-09-17 | Disposition: A | Discharge status: Home / Self Care | Attending: Emergency Medicine | Admitting: Emergency Medicine

## 2023-09-17 DIAGNOSIS — O4690 Antepartum hemorrhage, unspecified, unspecified trimester: Secondary | ICD-10-CM

## 2023-09-17 DIAGNOSIS — O3680X0 Pregnancy with inconclusive fetal viability, not applicable or unspecified: Secondary | ICD-10-CM

## 2023-09-17 NOTE — ED Notes (Signed)
 Patient given discharge instructions including importance of follow up appt as needed and return precautions if worsening. Patient stable and ambulatory with steady even gait on dispo.

## 2023-09-17 NOTE — ED Provider Notes (Signed)
 [facility] Provider Note   Event Date/Time   First MD Initiated Contact with Patient 09/17/23 0023     (approximate) History  Vaginal Bleeding  HPI Diane Kelly is a 23 y.o. female with a stated past medical history of infertility who presents complaining of vaginal bleeding in the setting of a recent positive pregnancy test.  Patient endorses mild cramping as well as vaginal bleeding that has been present over the last 4 days.  Patient states that this bleeding initially was fairly heavy however has gotten lighter over the last few days.  Patient denies any exacerbating or relieving factors. ROS: Patient currently denies any vision changes, tinnitus, difficulty speaking, facial droop, sore throat, chest pain, shortness of breath, abdominal pain, nausea/vomiting/diarrhea, dysuria, or weakness/numbness/paresthesias in any extremity   Physical Exam  Triage Vital Signs: ED Triage Vitals  Encounter Vitals Group     BP 09/16/23 2150 125/85     Girls Systolic BP Percentile --      Girls Diastolic BP Percentile --      Boys Systolic BP Percentile --      Boys Diastolic BP Percentile --      Pulse Rate 09/16/23 2150 77     Resp 09/16/23 2150 18     Temp 09/16/23 2150 98.5 F (36.9 C)     Temp Source 09/16/23 2150 Oral     SpO2 09/16/23 2150 98 %     Weight 09/16/23 2147 103 lb (46.7 kg)     Height 09/16/23 2147 5' 2 (1.575 m)     Head Circumference --      Peak Flow --      Pain Score 09/16/23 2147 4     Pain Loc --      Pain Education --      Exclude from Growth Chart --    Most recent vital signs: Vitals:   09/16/23 2150 09/17/23 0105  BP: 125/85 114/79  Pulse: 77 60  Resp: 18 16  Temp: 98.5 F (36.9 C)   SpO2: 98% 100%   General: Awake, oriented x4. CV:  Good peripheral perfusion. Resp:  Normal effort. Abd:  No distention.  Nontender to palpation Other:  Young adult well-developed, well-nourished Caucasian female resting comfortably in no  acute distress ED Results / Procedures / Treatments  Labs (all labs ordered are listed, but only abnormal results are displayed) Labs Reviewed  COMPREHENSIVE METABOLIC PANEL WITH GFR - Abnormal; Notable for the following components:      Result Value   AST 12 (*)    All other components within normal limits  URINALYSIS, ROUTINE W REFLEX MICROSCOPIC - Abnormal; Notable for the following components:   Color, Urine YELLOW (*)    APPearance CLOUDY (*)    Hgb urine dipstick MODERATE (*)    All other components within normal limits  HCG, QUANTITATIVE, PREGNANCY - Abnormal; Notable for the following components:   hCG, Beta Chain, Quant, S 99 (*)    All other components within normal limits  CBC  POC URINE PREG, ED   RADIOLOGY ED MD interpretation: Inconclusive OB ultrasound showing no localization of pregnancy which may represent intrauterine gestation too early to visualize, spontaneous abortion, or occult ectopic gestation - All radiology independently interpreted and agree with radiology assessment Official radiology report(s): US  OB LESS THAN 14 WEEKS WITH OB TRANSVAGINAL Result Date: 09/17/2023 CLINICAL DATA:  [id number] Vaginal bleeding in pregnancy [id number]. Last menstrual period 07/08/2023. Gestational age by last menstrual period 39  weeks and 1 day. Concern for ectopic pregnancy. EXAM: OBSTETRIC <14 WK ULTRASOUND TECHNIQUE: Transabdominal ultrasound was performed for evaluation of the gestation as well as the maternal uterus and adnexal regions. COMPARISON:  None Available. FINDINGS: Intrauterine gestational sac: None Yolk sac:  Not Visualized. Embryo:  Not Visualized. Cardiac Activity: Not Visualized. Subchorionic hemorrhage:  None visualized. Maternal uterus/adnexae: Bilateral ovaries are unremarkable. IMPRESSION: Non-localization of the pregnancy on this scan. No intrauterine gestational sac. No abnormal ovarian or adnexal masses. Differential diagnosis includes intrauterine gestation too  early to visualize, spontaneous abortion, or occult ectopic gestation. Recommend close clinical follow-up and serial serum beta-HCG monitoring, with repeat obstetric scan as warranted by beta-HCG levels and clinical assessment. Electronically Signed   By: Morgane  Naveau M.D.   On: 09/17/2023 01:03   PROCEDURES: Critical Care performed: No Procedures MEDICATIONS ORDERED IN ED: Medications - No data to display IMPRESSION / MDM / ASSESSMENT AND PLAN / ED COURSE  I reviewed the triage vital signs and the nursing notes.                             The patient is on the cardiac monitor to evaluate for evidence of arrhythmia and/or significant heart rate changes. Patient's presentation is most consistent with acute presentation with potential threat to life or bodily function. Workup: CBC, BMP, UA, bHCG, OB Ultrasound  Based on History, Exam, and ED Workup patient's presentation not consistent with molar pregnancy, life-threatening coagulopathy, trauma, serious bacterial infection, central process or other emergency. Due to pregnancy of undetermined anatomic location, patient has scheduled follow-up tomorrow bHCG Patient shows no signs of ruptured ectopic pregnancy including no abdominal pain, normal hemoglobin, and stable vital signs Patient was offered pelvic exam however states that she is going to be seen tomorrow and does not want to get 1 tonight as well as tomorrow. Disposition: Will discharge home with return precautions and instruction for prompt OBGYN follow up.   FINAL CLINICAL IMPRESSION(S) / ED DIAGNOSES   Final diagnoses:  Vaginal bleeding in pregnancy  Pregnancy of unknown anatomic location   Rx / DC Orders   ED Discharge Orders     None      Note:  This document was prepared using Dragon voice recognition software and may include unintentional dictation errors.   Neena Beecham K, MD 09/17/23 [phone]

## 2023-09-21 NOTE — Progress Notes (Signed)
 History of Present Illness:   Diane Kelly is a 23 y.o. female here for   Verbally consented to the use of AI for note-taking.   Chief Complaint  Patient presents with  . Follow-up    History of Present Illness Diane Kelly is a 23 year old female who presents for follow-up after a miscarriage.  She is following up after a miscarriage with HCG levels trending down, the most recent being 34.5. Bleeding has stopped and lasted less than two weeks. An emergency room ultrasound showed no abnormalities.  She is concerned about her ability to conceive and mentions a history of heavy alcohol use, which she has significantly reduced. She last consumed alcohol earlier this month and now drinks infrequently.  No current cramping, nausea, or vomiting. She experiences some fatigue and memory issues. Her B12 levels were low in May, and she has been taking prenatal vitamins that include vitamin D. She is considering additional B12 supplementation but is unsure of the appropriate dosage.   Past Medical History:   Past Medical History:  Diagnosis Date  . Alcohol abuse   . Alcohol intoxication with mild use disorder () 01/05/2018  . Anemia   . Angiocentric glioma of brain (CMS/HHS-HCC)   . Anxiety   . Brain tumor (benign) (CMS/HHS-HCC)   . Cannabis abuse   . Deliberate self-cutting   . Depression   . DMDD (disruptive mood dysregulation disorder) (CMS/HHS-HCC)   . Heart murmur   . Nicotine  abuse   . 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 Seizures (CMS/HHS-HCC)     Past Surgical History:   Past Surgical History:  Procedure Laterality Date  . APPLICATION OF CRANIAL NAVIGATION   01/25/2023   Surgeon: Clois Fret, MD; Location: [facility] ORS; Service: Neurosurgery; Laterality: N/A  . RIGHT CRANIOTOMY FOR TUMOR RESECTION Right 01/25/2023   Surgeon: Clois Fret, MD; Location: [facility] ORS; Service: Neurosurgery; Laterality: Right    Allergies:  No Known Allergies  Current  Medications:   Prior to Admission medications  Medication Sig Taking? Last Dose  lamoTRIgine  (LAMICTAL ) 200 MG tablet Take 1 tablet (200 mg total) by mouth every 12 (twelve) hours Yes Taking  prenatal vitamin with iron-folic acid (PRENATAL TABLETS) tablet Take 1 tablet by mouth once daily Yes Taking    Family History:   Family History  Problem Relation Name Age of Onset  . Alcohol abuse Mother Clotilda and angelita   . Anxiety Mother Clotilda and angelita   . High blood pressure (Hypertension) Mother Clotilda and angelita   . COPD Mother Clotilda and angelita   . Bipolar disorder Father Darryle   . High blood pressure (Hypertension) Maternal Uncle Christopher   . Osteoporosis (Thinning of bones) Paternal Wylie Dux   . Breast cancer Maternal Grandmother Merlynn   . COPD Maternal Grandmother Merlynn   . Anxiety Maternal Grandmother Merlynn   . Depression Paternal Grandmother    . Asthma Paternal Grandmother    . Anxiety Paternal Grandmother      Social History:   Social History   Socioeconomic History  . Marital status: Single  Tobacco Use  . Smoking status: Former    Current packs/day: 0.25    Types: Cigarettes    Passive exposure: Never  . Smokeless tobacco: Never  Vaping Use  . Vaping status: Every Day  Substance and Sexual Activity  . Alcohol use: Not Currently    Comment: says that she has quit  . Drug use: Yes    Comment: marijuana sometimes, THC  .  Sexual activity: Yes    Partners: Female, Female    Birth control/protection: None   Social Drivers of Health   Financial Resource Strain: Medium Risk (09/21/2023)   Overall Financial Resource Strain (CARDIA)   . Difficulty of Paying Living Expenses: Somewhat hard  Food Insecurity: No Food Insecurity (09/21/2023)   Hunger Vital Sign   . Worried About Programme researcher, broadcasting/film/video in the Last Year: Never true   . Ran Out of Food in the Last Year: Never true  Transportation Needs: No Transportation Needs (09/21/2023)   PRAPARE - Transportation    . Lack of Transportation (Medical): No   . Lack of Transportation (Non-Medical): No  Housing Stability: Low Risk  (09/21/2023)   Housing Stability Vital Sign   . Unable to Pay for Housing in the Last Year: No   . Number of Times Moved in the Last Year: 0   . Homeless in the Last Year: No    Review of Systems:   A 10 point review of systems is negative, except for the pertinent positives and negatives detailed in the HPI.  Vitals:   Vitals:   09/21/23 1104  BP: 100/68  Pulse: 96  SpO2: 98%  Weight: 47 kg (103 lb 9.6 oz)  Height: 157.5 cm (5' 2)     Body mass index is 18.95 kg/m.  Physical Exam:   Physical Exam Vitals and nursing note reviewed.  Constitutional:      General: She is not in acute distress.    Appearance: Normal appearance. She is not ill-appearing, toxic-appearing or diaphoretic.  HENT:     Head: Normocephalic and atraumatic.     Right Ear: External ear normal.     Left Ear: External ear normal.  Eyes:     General:        Right eye: No discharge.        Left eye: No discharge.     Conjunctiva/sclera: Conjunctivae normal.  Cardiovascular:     Rate and Rhythm: Normal rate and regular rhythm.     Pulses: Normal pulses.     Heart sounds: Normal heart sounds. No murmur heard.    No friction rub. No gallop.  Pulmonary:     Effort: Pulmonary effort is normal. No respiratory distress.     Breath sounds: Normal breath sounds. No stridor. No wheezing, rhonchi or rales.  Chest:     Chest wall: No tenderness.  Skin:    General: Skin is warm and dry.     Capillary Refill: Capillary refill takes less than 2 seconds.  Neurological:     Mental Status: She is alert.     Assessment and Plan:  No results found for this visit on 09/21/23.  Diagnoses and all orders for this visit:  Inappropriate level of quantitative hCG for gestational age in early pregnancy (HHS-HCC) -     Beta HCG, Quantitative, Blood; Future  Other fatigue -     Vitamin B1  (Thiamine), Blood - [facility] -     Vitamin B12 -     Vitamin D, 25-Hydroxy - [facility]  Memory changes -     Vitamin B1 (Thiamine), Blood - [facility] -     Vitamin B12 -     Vitamin D, 25-Hydroxy - [facility]  History of alcohol use -     Vitamin B1 (Thiamine), Blood - [facility] -     Vitamin B12 -     Vitamin D, 25-Hydroxy - [facility]    Assessment & Plan Miscarriage,  early pregnancy HCG levels are trending down, indicative of a miscarriage. Bleeding has stopped, and it was very early in the pregnancy, less than two weeks. An ultrasound in the emergency room showed no abnormalities, but a more detailed ultrasound is needed to check for structural abnormalities that could cause recurrent miscarriages. - Follow HCG levels to zero to ensure complete miscarriage. - Refer to GYN for further evaluation and ultrasound to check for structural abnormalities. - Schedule lab work for Friday, September 24, 2023. - Provide phone number for GYN to facilitate appointment scheduling.  Seizure disorder Currently managed with Lamotrigine . There was confusion regarding the dosage due to changes in prescription instructions. Previously on 200 mg in the morning and 250 mg at night. Adjustments were made during pregnancy, but now needs to revert to previous dosing as she is no longer pregnant. - Take Lamotrigine  200 mg in the morning and 250 mg at night. - Ensure folic acid supplementation to lower the risk of defects from antiepileptic medication. - Discontinue Effexor as it was not taken and is not needed.  Vitamin B12 deficiency, under evaluation Previous low B12 levels noted in May. Alcohol consumption may have contributed to deficiency. Prenatal vitamins being taken contain vitamin D, but B12 supplementation may be needed. - Order lab tests to check vitamin levels on Friday, September 24, 2023. - Consider B12 supplementation if levels remain low after lab results.  Alcohol abuse Currently reduced intake  significantly. Last alcohol consumption was earlier in July 2025. Alcohol use may have impacted fertility and vitamin levels. - Monitor alcohol consumption and encourage continued reduction.   Follow up She will call the office with any new or worsening symptoms   This note has been created using automated tools and reviewed for accuracy by provider.  Patient received an After Visit Summary    Attestation Statement:   I personally performed the service, non-incident to. (WP)   GLENDA MACARIO HADDOCK, NP

## 2023-09-28 ENCOUNTER — Encounter: Payer: Self-pay | Admitting: Emergency Medicine

## 2023-09-28 ENCOUNTER — Other Ambulatory Visit: Payer: Self-pay

## 2023-09-28 DIAGNOSIS — N390 Urinary tract infection, site not specified: Secondary | ICD-10-CM | POA: Diagnosis not present

## 2023-09-28 DIAGNOSIS — R1031 Right lower quadrant pain: Secondary | ICD-10-CM | POA: Diagnosis present

## 2023-09-28 LAB — CBC
HCT: 37.1 % (ref 36.0–46.0)
Hemoglobin: 12.5 g/dL (ref 12.0–15.0)
MCH: 29.3 pg (ref 26.0–34.0)
MCHC: 33.7 g/dL (ref 30.0–36.0)
MCV: 86.9 fL (ref 80.0–100.0)
Platelets: 250 K/uL (ref 150–400)
RBC: 4.27 MIL/uL (ref 3.87–5.11)
RDW: 13.2 % (ref 11.5–15.5)
WBC: 8.3 K/uL (ref 4.0–10.5)
nRBC: 0 % (ref 0.0–0.2)

## 2023-09-28 LAB — URINALYSIS, ROUTINE W REFLEX MICROSCOPIC
Bilirubin Urine: NEGATIVE
Glucose, UA: NEGATIVE mg/dL
Ketones, ur: NEGATIVE mg/dL
Nitrite: NEGATIVE
Protein, ur: 30 mg/dL — AB
Specific Gravity, Urine: 1.018 (ref 1.005–1.030)
WBC, UA: >50 WBC/hpf (ref 0–5)
pH: 7 (ref 5.0–8.0)

## 2023-09-28 LAB — COMPREHENSIVE METABOLIC PANEL WITH GFR
ALT: 11 U/L (ref 0–44)
AST: 13 U/L — ABNORMAL LOW (ref 15–41)
Albumin: 3.9 g/dL (ref 3.5–5.0)
Alkaline Phosphatase: 70 U/L (ref 38–126)
Anion gap: 8 (ref 5–15)
BUN: 15 mg/dL (ref 6–20)
CO2: 28 mmol/L (ref 22–32)
Calcium: 9.2 mg/dL (ref 8.9–10.3)
Chloride: 102 mmol/L (ref 98–111)
Creatinine, Ser: 0.87 mg/dL (ref 0.44–1.00)
GFR, Estimated: >60 mL/min (ref >60)
Glucose, Bld: 102 mg/dL — ABNORMAL HIGH (ref 70–99)
Potassium: 3.6 mmol/L (ref 3.5–5.1)
Sodium: 138 mmol/L (ref 135–145)
Total Bilirubin: 0.4 mg/dL (ref 0.0–1.2)
Total Protein: 7 g/dL (ref 6.5–8.1)

## 2023-09-28 LAB — LIPASE, BLOOD: Lipase: 49 U/L (ref 11–51)

## 2023-09-28 LAB — HCG, QUANTITATIVE, PREGNANCY: hCG, Beta Chain, Quant, S: 1 m[IU]/mL (ref <5)

## 2023-09-28 NOTE — ED Notes (Signed)
 Patient attempted to give a urine sample, was unsuccessful. This NT gave her a cup in case she needs to go later. No acute needs at this time.

## 2023-09-28 NOTE — ED Triage Notes (Signed)
 Pt arrived via POV with reports of RLQ abd pain for about 1 week, pt reports no bleeding states she is post miscarriage, but denies passing any clots. Pt reports she has had sex since the miscarriage.  Pt reports OBGYN appt on Thursday.

## 2023-09-29 ENCOUNTER — Emergency Department
Admission: EM | Admit: 2023-09-29 | Discharge: 2023-09-29 | Disposition: A | Discharge status: Home / Self Care | Attending: Emergency Medicine | Admitting: Emergency Medicine

## 2023-09-29 ENCOUNTER — Emergency Department

## 2023-09-29 DIAGNOSIS — R1031 Right lower quadrant pain: Secondary | ICD-10-CM

## 2023-09-29 DIAGNOSIS — N390 Urinary tract infection, site not specified: Secondary | ICD-10-CM

## 2023-09-29 LAB — WET PREP, GENITAL
Clue Cells Wet Prep HPF POC: NONE SEEN
Sperm: NONE SEEN
Trich, Wet Prep: NONE SEEN
WBC, Wet Prep HPF POC: >=10 — AB (ref <10)
Yeast Wet Prep HPF POC: NONE SEEN

## 2023-09-29 LAB — CHLAMYDIA/NGC RT PCR (ARMC ONLY)
Chlamydia Tr: NOT DETECTED
N gonorrhoeae: NOT DETECTED

## 2023-09-29 MED ORDER — ONDANSETRON 4 MG PO TBDP: 4.0000 mg | ORAL_TABLET | Freq: Four times a day (QID) | ORAL | 0 refills | Status: DC | PRN

## 2023-09-29 MED ORDER — CEPHALEXIN 500 MG PO CAPS: 500.0000 mg | ORAL_CAPSULE | Freq: Two times a day (BID) | ORAL | 0 refills | Status: AC

## 2023-09-29 MED ORDER — KETOROLAC TROMETHAMINE 30 MG/ML IJ SOLN
30.0000 mg | Freq: Once | INTRAMUSCULAR | Status: AC
  Administered 2023-09-29: 30 mg via INTRAVENOUS
  Filled 2023-09-29: qty 1

## 2023-09-29 MED ORDER — IOHEXOL 300 MG/ML  SOLN
80.0000 mL | Freq: Once | INTRAMUSCULAR | Status: AC | PRN
  Administered 2023-09-29: 80 mL via INTRAVENOUS

## 2023-09-29 MED ORDER — SODIUM CHLORIDE 0.9 % IV SOLN
2.0000 g | Freq: Once | INTRAVENOUS | Status: AC
  Administered 2023-09-29: 2 g via INTRAVENOUS
  Filled 2023-09-29: qty 20

## 2023-09-29 MED ORDER — IOHEXOL 9 MG/ML PO SOLN
500.0000 mL | ORAL | Status: AC
  Administered 2023-09-29: 500 mL via ORAL

## 2023-09-29 MED ORDER — IBUPROFEN 800 MG PO TABS: 800.0000 mg | ORAL_TABLET | Freq: Three times a day (TID) | ORAL | 0 refills | Status: AC | PRN

## 2023-09-29 MED ORDER — SODIUM CHLORIDE 0.9 % IV BOLUS (SEPSIS)
1000.0000 mL | Freq: Once | INTRAVENOUS | Status: AC
  Administered 2023-09-29: 1000 mL via INTRAVENOUS

## 2023-09-29 NOTE — ED Notes (Signed)
 Pt presented to ED with c/o RLQ abd pian starting x 2-3 days. Endorses nausea. Denies fevers, vomiting. States thinks she has a UTI, states sent off a UA and suggested infection. Denies dysuria but endorses odor with urine, states has noticed odor for a while. Recent miscarriage.

## 2023-09-29 NOTE — Discharge Instructions (Addendum)
 You may alternate over the counter Tylenol 1000 mg every 6 hours as needed for pain, fever and Ibuprofen 800 mg every 6-8 hours as needed for pain, fever.  Please take Ibuprofen with food.  Do not take more than 4000 mg of Tylenol (acetaminophen) in a 24 hour period.

## 2023-09-29 NOTE — ED Provider Notes (Signed)
 [facility] Provider Note    Event Date/Time   First MD Initiated Contact with Patient 09/29/23 0104     (approximate)   History   Abdominal Pain   HPI  Diane Kelly is a 23 y.o. female with history of depression, seizures status post angiocentric glioma resection who presents to the emergency department with right lower quadrant abdominal pain for 2 to 3 days with associated nausea.  No vomiting, diarrhea.  No dysuria but states she does have odor and vaginal discharge.  States she recently had a miscarriage.  No longer having any vaginal bleeding.  hCG peaked at 143 on 09/15/2023 and then subsequently dropped.  States she has resumed sexual intercourse with her partner but waited until her bleeding had completely resolved.   History provided by patient, significant other.    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 Authorizing Provider  ibuprofen  (ADVIL ) 200 MG tablet Take 800 mg by mouth every 6 (six) hours as needed for moderate  pain (pain score 4-6).    [provider]  lamoTRIgine  (LAMICTAL ) 150 MG tablet Take 150 mg by mouth 2 (two) times daily. 01/18/23   [provider]  levETIRAcetam  (KEPPRA ) 500 MG tablet Take 1 tablet (500 mg total) by mouth 2 (two) times daily. Patient taking differently: Take 500 mg by mouth 2 (two) times daily. Pt and caregiver reports she has been taking once a day 01/29/23   Ilah Corean HERO, PA-C    Physical Exam   Triage Vital Signs: ED Triage Vitals  Encounter Vitals Group     BP 09/28/23 2131 (!) 134/93     Girls Systolic BP Percentile --      Girls Diastolic BP Percentile --      Boys Systolic BP Percentile --      Boys Diastolic BP Percentile --      Pulse Rate 09/28/23 2131 76     Resp 09/28/23 2131 17     Temp 09/28/23 2131 99.7 F (37.6 C)     Temp Source 09/28/23 2131 Oral     SpO2 09/28/23 2131 100 %     Weight 09/28/23 2131 103 lb (46.7 kg)     Height 09/28/23 2131 5' 2 (1.575 m)     Head Circumference --      Peak Flow --      Pain Score 09/28/23 2135 7     Pain Loc --  Pain Education --      Exclude from Growth Chart --     Most recent vital signs: Vitals:   09/29/23 0300 09/29/23 0355  BP: 112/60 102/60  Pulse: 81 76  Resp:  18  Temp:    SpO2: 100% 100%    CONSTITUTIONAL: Alert, responds appropriately to questions. Well-appearing; well-nourished HEAD: Normocephalic, atraumatic EYES: Conjunctivae clear, pupils appear equal, sclera nonicteric ENT: normal nose; moist mucous membranes NECK: Supple, normal ROM CARD: RRR; S1 and S2 appreciated RESP: Normal chest excursion without splinting or tachypnea; breath sounds clear and equal bilaterally; no wheezes, no rhonchi, no rales, no hypoxia or respiratory distress, speaking full sentences ABD/GI: Non-distended; soft, tender in the right lower quadrant without guarding or rebound BACK: The back appears normal EXT: Normal ROM in all joints; no deformity noted, no edema SKIN:  Normal color for age and race; warm; no rash on exposed skin NEURO: Moves all extremities equally, normal speech PSYCH: The patient's mood and manner are appropriate.   ED Results / Procedures / Treatments   LABS: (all labs ordered are listed, but only abnormal results are displayed) Labs Reviewed  WET PREP, GENITAL - Abnormal; Notable for the following components:      Result Value   WBC, Wet Prep HPF POC >=10 (*)    All other components within normal limits  COMPREHENSIVE METABOLIC PANEL WITH GFR - Abnormal; Notable for the following components:   Glucose, Bld 102 (*)    AST 13 (*)    All other components within normal limits  URINALYSIS, ROUTINE W REFLEX MICROSCOPIC - Abnormal; Notable for the following components:   Color, Urine YELLOW (*)    APPearance TURBID (*)    Hgb urine dipstick SMALL (*)    Protein, ur 30 (*)    Leukocytes,Ua LARGE (*)    Bacteria, UA MANY (*)    All other components within normal limits  CHLAMYDIA/NGC RT PCR ([facility] ONLY)            URINE CULTURE  LIPASE, BLOOD  CBC  HCG, QUANTITATIVE, PREGNANCY     EKG:  EKG Interpretation Date/Time:    Ventricular Rate:    PR Interval:    QRS Duration:    QT Interval:    QTC Calculation:   R Axis:      Text Interpretation:           RADIOLOGY: My personal review and interpretation of imaging: Ultrasound shows normal ovary, CT shows normal appendix.  I have personally reviewed all radiology reports.   CT ABDOMEN PELVIS W CONTRAST Result Date: 09/29/2023 CLINICAL DATA:  RLQ abdominal pain EXAM: CT ABDOMEN AND PELVIS WITH CONTRAST TECHNIQUE: Multidetector CT imaging of the abdomen and pelvis was performed using the standard protocol following bolus administration of intravenous contrast. RADIATION DOSE REDUCTION: This exam was performed according to the departmental dose-optimization program which includes automated exposure control, adjustment of the mA and/or kV according to patient size and/or use  of iterative reconstruction technique. CONTRAST:  80mL OMNIPAQUE  IOHEXOL  300 MG/ML  SOLN COMPARISON:  None Available. FINDINGS: Lower chest: No acute abnormality. Hepatobiliary: No adrenal nodule bilaterally. Bilateral kidneys enhance symmetrically. No hydronephrosis. No hydroureter. The urinary bladder is unremarkable. Pancreas: No focal lesion. Normal pancreatic contour. No surrounding inflammatory changes. No main pancreatic ductal dilatation. Spleen: Normal in size without focal abnormality. Adrenals/Urinary Tract: No adrenal nodule bilaterally. Bilateral kidneys enhance symmetrically. No hydronephrosis. No hydroureter. The urinary bladder is unremarkable. Stomach/Bowel: PO contrast reaches the mid small  bowel. Stomach is within normal limits. Fecalized material within the terminal ileum may be due to incompetent ileocecal valve versus slow transition state. No definite constipation with normal amount of stool noted within the ascending colon proximal transverse colon. No evidence of bowel wall thickening or dilatation. Appendix appears normal. Vascular/Lymphatic: No abdominal aorta or iliac aneurysm. No abdominal, pelvic, or inguinal lymphadenopathy. Reproductive: Uterus and bilateral adnexa are unremarkable. Other: No intraperitoneal free fluid. No intraperitoneal free gas. No organized fluid collection. Musculoskeletal: No abdominal wall hernia or abnormality. No suspicious lytic or blastic osseous lesions. No acute displaced fracture. IMPRESSION: No acute intra-abdominal or intrapelvic abnormality. Electronically Signed   By: Morgane  Naveau M.D.   On: 09/29/2023 03:52   US  PELVIC COMPLETE W TRANSVAGINAL AND TORSION R/O Result Date: 09/29/2023 CLINICAL DATA:  [id number].  Pelvic pain, predominantly on the right. EXAM: TRANSABDOMINAL AND TRANSVAGINAL ULTRASOUND OF PELVIS DOPPLER ULTRASOUND OF OVARIES TECHNIQUE: Both transabdominal and transvaginal ultrasound examinations of the pelvis were performed.  Transabdominal technique was performed for global imaging of the pelvis including uterus, ovaries, adnexal regions, and pelvic cul-de-sac. It was necessary to proceed with endovaginal exam following the transabdominal exam to visualize the ovaries better. Color and duplex Doppler ultrasound was utilized to evaluate blood flow to the ovaries. COMPARISON:  None. FINDINGS: Uterus Measurements: Anteverted measuring 5.9 x 2.7 x 3.7 cm = volume: 31.2 mL. No fibroids or other mass visualized. Endometrium Thickness: 5.4 mm. No focal abnormality visualized. No abnormal color flow. Right ovary Measurements: 4.2 x 2.2 x 4.0 cm = volume: 19.8 mL. There is an asymmetrically greater number of simple subcentimeter follicles visible on this ovary. No adnexal mass. Left ovary Measurements: 4.1 x 1.8 x 1.9 cm = volume: 7.5 mL. Normal appearance/no adnexal mass. Pulsed Doppler evaluation of both ovaries demonstrates normal low-resistance arterial and venous waveforms. Other findings No abnormal free fluid. IMPRESSION: 1. No evidence of ovarian torsion or mass. 2. Asymmetrically greater number of simple subcentimeter follicles visible on the right ovary. This is nonspecific but can be seen in the setting of polycystic ovarian syndrome. 3. Otherwise negative ultrasound. Electronically Signed   By: Francis Quam M.D.   On: 09/29/2023 01:12     PROCEDURES:  Critical Care performed: No     Procedures    IMPRESSION / MDM / ASSESSMENT AND PLAN / ED COURSE  I reviewed the triage vital signs and the nursing notes.    Patient here with right lower abdominal pain.  Temperature here of 99.7.  Nausea without vomiting.  Recent miscarriage.    DIFFERENTIAL DIAGNOSIS (includes but not limited to):   Appendicitis, TOA, torsion, ovarian cyst, ascending UTI, kidney stone, pyelonephritis, PID, retained products of conception, endometritis   Patient's presentation is most consistent with acute presentation with potential threat  to life or bodily function.   PLAN: Labs obtained from triage show no leukocytosis.  Normal LFTs, lipase.  Urine does appear infected.  Will send urine culture and give Rocephin .  Ultrasound reviewed and interpreted by myself and the radiologist and shows follicles on the right ovary but no cyst, torsion, TOA.  Will obtain CT of the abdomen pelvis to evaluate for her appendix but also to rule out infected kidney stone, pyelonephritis.  Will give IV fluids, pain and nausea medicine.   MEDICATIONS GIVEN IN ED: Medications  iohexol  (OMNIPAQUE ) 9 MG/ML oral solution 500 mL (500 mLs Oral Contrast Given 09/29/23 0149)  sodium chloride  0.9 % bolus 1,000 mL (0 mLs Intravenous Stopped 09/29/23 0325)  ketorolac  (TORADOL ) 30 MG/ML injection 30 mg (30 mg Intravenous Given 09/29/23 0140)  cefTRIAXone  (ROCEPHIN ) 2 g in sodium chloride  0.9 % 100 mL IVPB (0 g Intravenous Stopped 09/29/23 0210)  iohexol  (OMNIPAQUE ) 300 MG/ML solution 80 mL (80 mLs Intravenous Contrast Given 09/29/23 0341)     ED COURSE: CT scan reviewed and interpreted by myself and the radiologist and shows a normal appendix and no other acute abnormality.  Normal ureters, kidneys.  Pelvic swabs also unremarkable.  Negative wet prep, gonorrhea and chlamydia.  Suspect UTI as the cause of patient's symptoms today.  Will discharge with Keflex .  Recommend alternating Tylenol , Motrin .  Will discharge with Zofran .  Patient verbalized understanding is comfortable with this plan.  At this time, I do not feel there is any life-threatening condition present. I reviewed all nursing notes, vitals, pertinent previous records.  All lab and urine results, EKGs, imaging ordered have been independently reviewed and interpreted by myself.  I reviewed all available radiology reports from any imaging ordered this visit.  Based on my assessment, I feel the patient is safe to be discharged home without further emergent workup and can continue workup as an outpatient as needed.  Discussed all findings, treatment plan as well as usual and customary return precautions.  They verbalize understanding and are comfortable with this plan.  Outpatient follow-up has been provided as needed.  All questions have been answered.    CONSULTS:  none   OUTSIDE RECORDS REVIEWED: Reviewed recent PCP notes.       FINAL CLINICAL IMPRESSION(S) / ED DIAGNOSES   Final diagnoses:  RLQ abdominal pain  Acute UTI     Rx / DC Orders   ED Discharge Orders          Ordered    cephALEXin  (KEFLEX ) 500 MG capsule  2 times daily        09/29/23 0356    ibuprofen  (ADVIL ) 800 MG tablet  Every 8 hours PRN        09/29/23 0356    ondansetron  (ZOFRAN -ODT) 4 MG disintegrating tablet  Every 6 hours PRN        09/29/23 0356             Note:  This document was prepared using Dragon voice recognition software and may include unintentional dictation errors.   Jenevie Casstevens, Josette SAILOR, DO 09/29/23 [phone]

## 2023-10-01 LAB — URINE CULTURE: Culture: >=100000 — AB

## 2023-11-15 ENCOUNTER — Ambulatory Visit: Admitting: Obstetrics & Gynecology

## 2023-12-01 ENCOUNTER — Ambulatory Visit: Admitting: Obstetrics & Gynecology

## 2023-12-03 ENCOUNTER — Ambulatory Visit: Admitting: Advanced Practice Midwife

## 2023-12-16 ENCOUNTER — Ambulatory Visit: Admitting: Obstetrics

## 2023-12-16 ENCOUNTER — Encounter: Payer: Self-pay | Admitting: Obstetrics

## 2023-12-16 VITALS — BP 105/80 | HR 100 | Ht 62.0 in | Wt 107.0 lb

## 2023-12-16 DIAGNOSIS — Z3169 Encounter for other general counseling and advice on procreation: Secondary | ICD-10-CM | POA: Diagnosis not present

## 2023-12-16 NOTE — Progress Notes (Addendum)
   GYN ENCOUNTER  Subjective  HPI: Diane Kelly is a 23 y.o. G1P0010 who presents today for preconception counseling. She is worried that she will not be able to carry a pregnancy to term. She has a h/o amenorrhea from alcohol overuse. After she quit drinking, she had regular periods for about 1-1.5 years. She had a miscarriage in late July/early August. She did not menstruate in September and had a light period about 2 weeks ago.  She has a h/o epilepsy and had a brain tumor removed in December 2024. She also reports significant weight loss (about 35 lbs) since she stopped drinking. She does not smoke cigarettes, but she vapes. She denies h/o STIs, PID, and surgery on her reproductive tract. Recent TSH and A1C are normal.   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   Vapes nicotine  containing substance    Past Surgical History:  Procedure Laterality Date   APPLICATION OF CRANIAL NAVIGATION N/A 01/25/2023   Procedure: APPLICATION OF CRANIAL NAVIGATION;  Surgeon: Clois Fret, MD;  Location: [facility] ORS;  Service: Neurosurgery;  Laterality: N/A;   CRANIOTOMY Right 01/25/2023   Procedure: RIGHT CRANIOTOMY FOR TUMOR RESECTION;  Surgeon: Clois Fret, MD;  Location: [facility] ORS;  Service: Neurosurgery;  Laterality: Right;   NO PAST SURGERIES     OB History     Gravida  1   Para  0   Term  0   Preterm  0   AB  1   Living  0      SAB  1   IAB  0   Ectopic  0   Multiple  0   Live Births  0          No Known Allergies  ROS: See HPI  Objective  BP 105/80   Pulse 100   Ht 5' 2 (1.575 m)   Wt 107 lb (48.5 kg)   LMP 11/30/2023  (Approximate)   Breastfeeding No   BMI 19.57 kg/m   Physical examination General: alert, cooperative, NAD Pelvic: deferred  Assessment -Preconception counseling  Plan -Discussed healthy life choices, including quitting vape and any substances, taking a daily multivitamin, regular exercise, and healthy eating habits. -In-depth instruction on fertility awareness, including BBT, cervical mucus, OPKs, and timing of intercourse. Encouraged to track cycles. -Discussed that miscarriage is not an uncommon outcome, and many people carry a healthy pregnancy to term after a miscarriage.  -Return if pregnancy is not achieved after one year of regular cycles with timed intercourse, or if cycles do not return to normal.  Eleanor Canny, CNM

## 2024-01-24 ENCOUNTER — Ambulatory Visit: Admission: RE | Admit: 2024-01-24 | Source: Ambulatory Visit

## 2024-01-27 ENCOUNTER — Telehealth: Payer: Self-pay | Admitting: Internal Medicine

## 2024-01-27 NOTE — Telephone Encounter (Signed)
 Called pt and Pts boyfriend and let them know we had to reschedule her appt with [facility] that is scheduled for tomorrow. She did not show for her mri and that was needed prior to the visit. We got her mri rescheduled and also the appt with vaslow. They confirmed that the date and time for both appts will work for them.

## 2024-01-28 ENCOUNTER — Inpatient Hospital Stay: Admitting: Internal Medicine

## 2024-02-01 ENCOUNTER — Ambulatory Visit: Admission: RE | Admit: 2024-02-01 | Discharge: 2024-02-01 | Attending: Internal Medicine

## 2024-02-01 ENCOUNTER — Encounter: Payer: Self-pay | Admitting: Radiology

## 2024-02-01 DIAGNOSIS — C719 Malignant neoplasm of brain, unspecified: Secondary | ICD-10-CM

## 2024-02-01 MED ORDER — GADOBUTROL 1 MMOL/ML IV SOLN
4.0000 mL | Freq: Once | INTRAVENOUS | Status: AC | PRN
  Administered 2024-02-01: 4 mL via INTRAVENOUS

## 2024-02-04 ENCOUNTER — Inpatient Hospital Stay: Attending: Internal Medicine | Admitting: Internal Medicine

## 2024-02-04 VITALS — BP 124/77 | HR 85 | Resp 18 | Ht 62.0 in | Wt 108.0 lb

## 2024-02-04 DIAGNOSIS — C712 Malignant neoplasm of temporal lobe: Secondary | ICD-10-CM | POA: Insufficient documentation

## 2024-02-04 DIAGNOSIS — F17210 Nicotine dependence, cigarettes, uncomplicated: Secondary | ICD-10-CM | POA: Insufficient documentation

## 2024-02-04 DIAGNOSIS — Z79899 Other long term (current) drug therapy: Secondary | ICD-10-CM | POA: Insufficient documentation

## 2024-02-04 DIAGNOSIS — C719 Malignant neoplasm of brain, unspecified: Secondary | ICD-10-CM

## 2024-02-04 DIAGNOSIS — R569 Unspecified convulsions: Secondary | ICD-10-CM | POA: Diagnosis not present

## 2024-02-04 MED ORDER — LAMOTRIGINE 100 MG PO TABS: 200.0000 mg | ORAL_TABLET | Freq: Two times a day (BID) | ORAL | 5 refills | Status: AC

## 2024-02-04 MED ORDER — LACOSAMIDE 50 MG PO TABS: 50.0000 mg | ORAL_TABLET | Freq: Two times a day (BID) | ORAL | 3 refills | Status: AC

## 2024-02-04 NOTE — Progress Notes (Signed)
 [facility] at [facility] [address]  [place], [place] [address] [phone]   Interval Evaluation  Date of Service: 02/04/2024 Patient Name: Diane Kelly Patient MRN: [id number] Patient DOB: 02/23/2001 Provider: Arthea MARLA Manns, MD  Identifying Statement:  Diane Kelly is a 23 y.o. 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 Katrina.  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  She does describe a breakthrough seizure last week, typical semiology, shaking and disconnected.  She and her boyfriend also describe two other seizure events in the past 2-3 months.  Previously had been seizure free for 6 months.  No new or progressive changes today otherwise.  Continues on Lamictal  200mg  twice per day, Keppra  was discontinued due to mood effects.    H+P (03/19/23) Patient presented to neurologic attention initially at age 55, with new onset seizures.  These were/are described as eyes rolling back, disconnected for several minutes, followed by brief confusion.  In 2023, MRI brain demonstrated a lesion with the right temporal lobe suspicious for primary brain tumor.  Repeat scan in 2024 demonstrated slight growth.  She was referred to neurosurgery, underwent resection with Dr. Katrina on 01/27/23 without complication.  There was a post-surgical bout of status epilepticus, but she has been seizure free since that time.  Currently dosing lamictal  150mg  twice per day and Keppra  500mg  twice per day.  Medications: Current Outpatient Medications on File Prior to Visit  Medication Sig Dispense Refill   ibuprofen  (ADVIL ) 800 MG tablet Take 1 tablet (800 mg total) by mouth every 8  (eight) hours as needed. 30 tablet 0   lamoTRIgine  (LAMICTAL ) 100 MG tablet Take by mouth.     cephALEXin  (KEFLEX ) 500 MG capsule Take 1 capsule (500 mg total) by mouth 2 (two) times daily. (Patient not taking: Reported on 02/04/2024) 14 capsule 0   lamoTRIgine  (LAMICTAL ) 150 MG tablet Take 150 mg by mouth 2 (two) times daily. (Patient not taking: Reported on 02/04/2024)     No current facility-administered medications on file prior to visit.    Allergies: No Known Allergies Past Medical History:  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   Vapes nicotine  containing substance    Past Surgical History:  Past Surgical History:  Procedure Laterality Date   APPLICATION OF CRANIAL NAVIGATION N/A 01/25/2023   Procedure: APPLICATION OF CRANIAL NAVIGATION;  Surgeon: Clois Fret, MD;  Location: [facility] ORS;  Service: Neurosurgery;  Laterality: N/A;   CRANIOTOMY Right 01/25/2023   Procedure: RIGHT CRANIOTOMY FOR TUMOR RESECTION;  Surgeon: Clois Fret, MD;  Location: [facility] ORS;  Service: Neurosurgery;  Laterality: Right;   NO PAST SURGERIES     Social History:  Social History   Socioeconomic History   Marital status: Single    Spouse name: Not on file  Number of children: Not on file   Years of education: Not on file   Highest education level: Not on file  Occupational History   Not on file  Tobacco Use   Smoking status: Some Days    Current packs/day: 0.50    Types: Cigarettes   Smokeless tobacco: Current  Vaping Use   Vaping status: Every Day   Substances: Nicotine , Flavoring  Substance and Sexual Activity   Alcohol use: Not Currently     Comment: occ   Drug use: Not Currently    Comment: cbd   Sexual activity: Yes    Birth control/protection: Condom  Other Topics Concern   Not on file  Social History Narrative   Not on file   Social Drivers of Health   Tobacco Use: High Risk (12/16/2023)   Patient History    Smoking Tobacco Use: Some Days    Smokeless Tobacco Use: Current    Passive Exposure: Not on file  Financial Resource Strain: Medium Risk (09/21/2023)   Received from [facility]   Overall Financial Resource Strain (CARDIA)    Difficulty of Paying Living Expenses: Somewhat hard  Food Insecurity: No Food Insecurity (09/21/2023)   Received from [facility]   Epic    Within the past 12 months, you worried that your food would run out before you got the money to buy more.: Never true    Within the past 12 months, the food you bought just didn't last and you didn't have money to get more.: Never true  Transportation Needs: No Transportation Needs (09/21/2023)   Received from [facility] - Transportation    In the past 12 months, has lack of transportation kept you from medical appointments or from getting medications?: No    Lack of Transportation (Non-Medical): No  Physical Activity: Not on file  Stress: Not on file  Social Connections: Unknown (07/07/2022)   Received from [facility] Social Needs Screening - Social Connection    Would you like help with any of the following needs: food, medicine/medical supplies, transportation, loneliness, housing or utilities?: Not on file  Intimate Partner Violence: Not At Risk (01/29/2023)   Humiliation, Afraid, Rape, and Kick questionnaire    Fear of Current or Ex-Partner: No    Emotionally Abused: No    Physically Abused: No    Sexually Abused: No  Depression (PHQ2-9): Not on file  Alcohol Screen: Not on file  Housing: Low Risk  (09/21/2023)   Received from [facility]    Epic    In the last 12 months, was there a time when you were not able to pay the mortgage or rent on time?: No    In the past 12 months, how many times have you moved where you were living?: 0    At any time in the past 12 months, were you homeless or living in a shelter (including now)?: No  Utilities: Not At Risk (09/21/2023)   Received from [facility]   Epic    In the past 12 months has the electric, gas, oil, or water company threatened to shut off services in your home?: No  Health Literacy: Not on file   Family History:  Family History  Problem Relation Age of Onset   Hypertension Mother    COPD Mother    COPD Maternal Grandmother    Anxiety disorder Maternal Grandmother    Sleep  apnea Maternal Grandmother    COPD Maternal Grandfather    Anxiety disorder Paternal Grandmother    Asthma Paternal Grandmother    Depression Paternal Grandmother     Review of Systems: Constitutional: Doesn't report fevers, chills or abnormal weight loss Eyes: Doesn't report blurriness of vision Ears, nose, mouth, throat, and face: Doesn't report sore throat Respiratory: Doesn't report cough, dyspnea or wheezes Cardiovascular: Doesn't report palpitation, chest discomfort  Gastrointestinal:  Doesn't report nausea, constipation, diarrhea GU: Doesn't report incontinence Skin: Doesn't report skin rashes Neurological: Per HPI Musculoskeletal: Doesn't report joint pain Behavioral/Psych: Doesn't report anxiety  Physical Exam: Vitals:   02/04/24 1108 02/04/24 1109  BP:  124/77  Pulse:  85  Resp: 18     KPS: 90. General: Alert, cooperative, pleasant, in no acute distress Head: Normal EENT: No conjunctival injection or scleral icterus.  Lungs: Resp effort normal Cardiac: Regular rate Abdomen: Non-distended abdomen Skin: No rashes cyanosis or petechiae. Extremities: No clubbing or edema  Neurologic Exam: Mental Status: Awake, alert, attentive to examiner. Oriented to  self and environment. Language is fluent with intact comprehension.  Cranial Nerves: Visual acuity is grossly normal. Visual fields are full. Extra-ocular movements intact. No ptosis. Face is symmetric Motor: Tone and bulk are normal. Power is full in both arms and legs. Reflexes are symmetric, no pathologic reflexes present.  Sensory: Intact to light touch Gait: Normal.   Labs: I have reviewed the data as listed    Component Value Date/Time   NA 138 09/28/2023 2149   K 3.6 09/28/2023 2149   CL 102 09/28/2023 2149   CO2 28 09/28/2023 2149   GLUCOSE 102 (H) 09/28/2023 2149   BUN 15 09/28/2023 2149   CREATININE 0.87 09/28/2023 2149   CALCIUM 9.2 09/28/2023 2149   PROT 7.0 09/28/2023 2149   ALBUMIN 3.9 09/28/2023 2149   AST 13 (L) 09/28/2023 2149   ALT 11 09/28/2023 2149   ALKPHOS 70 09/28/2023 2149   BILITOT 0.4 09/28/2023 2149   GFRNONAA >60 09/28/2023 2149   GFRAA NOT CALCULATED 01/02/2018 2348   Lab Results  Component Value Date   WBC 8.3 09/28/2023   NEUTROABS 13.6 (H) 01/27/2023   HGB 12.5 09/28/2023   HCT 37.1 09/28/2023   MCV 86.9 09/28/2023   PLT 250 09/28/2023    Imaging:  [facility] Clinician Interpretation: I have personally reviewed the CNS images as listed.  My interpretation, in the context of the patient's clinical presentation, is stable disease  MR BRAIN W WO CONTRAST Result Date: 02/01/2024 EXAM: MRI BRAIN WITH AND WITHOUT CONTRAST 02/01/2024 02:39:53 PM TECHNIQUE: Multiplanar multisequence MRI of the head/brain was performed with and without the administration of intravenous contrast. COMPARISON: MRI brain 07/23/2023. CLINICAL HISTORY: Brain/CNS neoplasm, assess treatment response. FINDINGS: BRAIN AND VENTRICLES: No acute infarct. No acute intracranial hemorrhage. No mass effect or midline shift. No hydrocephalus. Changes of right pterional craniotomy for resection of right temporal lobe lesion. Similar appearance of the underlying resection cavity. FLAIR  hyperintensity along the margin of the resection cavity is similar to prior with areas of signal abnormality most pronounced along the superolateral aspect of the resection cavity. Signal abnormality favored to reflect postoperative changes versus less likely nonenhancing infiltrative tumor. There are no areas of gyral expansion or edema within the right temporal lobe. Similar appearance of the pituitary within normal limits for patient's age. Areas of curvilinear enhancement along the margin of the resection cavity are similar to slightly decreased from prior compatible with postsurgical change. There are  no areas of nodular or masslike enhancement at the resection site. 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 Normal flow voids. ORBITS: No acute abnormality. SINUSES: No acute abnormality. BONES AND SOFT TISSUES: Normal bone marrow signal and enhancement. No acute soft tissue abnormality. IMPRESSION: 1. Stable postoperative changes of right pterional craniotomy for resection of right temporal lobe lesion. 2. Similar appearance of the resection cavity with FLAIR signal abnormality along the margin, favored to reflect postoperative gliosis versus less likely non-enhancing infiltrative tumor. Electronically signed by: Donnice Mania MD 02/01/2024 08:45 PM EST RP Workstation: [id number]     Pathology: Case Report Surgical Pathology Report                         Case: [id number]                                 Authorizing Provider:  Clois Fret         Collected:           01/25/2023                 Ordering Location:     [facility] Surgical Pathology and Received:            01/29/2023 1407                                    Cytopathology                                                               Pathologist:           Cesario April, MD                                                          Specimen:    Brain, [id number]                                                                      Addendum   The case was sent to [facility] for methylation profiling. The composite methylation profile, using versions 11b6 and 12b6 of the [place] classifier, as well as versions 2.0 and 3.0 of the [employer] classifier, indicates a consensus match to Angiocentric glioma. Dimensionality reduction with UMAP and t-SNE also places this tumor in the same diagnostic class. A copy of the [facility] report [phone]) is scanned and attached to this report. The final integrated diagnosis is as follows:   A.  Outside consult, [phone], [facility], [place], [place].  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   Molecular information: Chromosome 6q interstitial deletion,  flanked by MYB and QKI Methylation class angiocentric glioma    Addendum electronically signed by Cesario April, MD on 03/18/2023 at  1:42 PM  DIAGNOSIS   A.  Outside consult, [phone], [facility], [place], [place].  Date of procedure 01/25/23:    Low grade glial/glioneuronal neoplasm.   Comment: The lesion is difficult to classify based on histomorphology, but is overall consistent with a low grade glial or glioneuronal neoplasm. Chromosomal microarray showed an interstitial deletion within 6q, flanked by MYB and QKI, which may result in a MYB::QKI fusion; however no oncogenic fusions were detected on NGS fusion panel. The differential diagnosis includes an angiocentric glioma, MYB-altered diffuse astrocytoma, or others. Methylation profiling is pending for final categorization of this neoplasm. An integrated diagnosis will be reported in an addendum when methylation results are available.    Electronically signed by Cesario April, MD on 02/23/2023     Assessment/Plan No diagnosis found.  Diane Kelly is clinically stable today.  MRI brain continues to demonstrate stable findings, now 1 year  removed from craniotomy.  We reviewed pathology, treatment recommendations for this rare tumor based on review of literature, and comparison to similar low grade glioneuronal tumors.  We will recommend continuing imaging surveillance only, at this time.  She is agreeable with this.  For breakthrough seizures, unprovoked, recommended addition of Vimpat 50mg  BID.  She will also continue Lamictal  200mg  BID which has been well tolerated.  We ask that Diane Kelly return to clinic in 4 months for seizure follow up, or sooner as needed.  Next MRI brain can be in 9 months.  All questions were answered. The patient knows to call the clinic with any problems, questions or concerns. No barriers to learning were detected.  The total time spent in the encounter was 40 minutes and more than 50% was on counseling and review of test results   Arthea MARLA Manns, MD Medical Director of Neuro-Oncology [facility] [place] [facility] 02/04/2024 11:17 AM

## 2024-02-09 ENCOUNTER — Encounter: Payer: Self-pay | Admitting: Internal Medicine

## 2024-06-09 ENCOUNTER — Inpatient Hospital Stay: Admitting: Internal Medicine
